# Patient Record
Sex: Female | Born: 1955 | Race: White | Hispanic: No | Marital: Married | Smoking: Former smoker
Health system: Southern US, Community
[De-identification: ages and names within clinical notes are randomized; demographics above are authoritative.]

## PROBLEM LIST (undated history)

## (undated) DIAGNOSIS — K219 Gastro-esophageal reflux disease without esophagitis: Secondary | ICD-10-CM

## (undated) DIAGNOSIS — R0989 Other specified symptoms and signs involving the circulatory and respiratory systems: Secondary | ICD-10-CM

## (undated) DIAGNOSIS — R002 Palpitations: Secondary | ICD-10-CM

## (undated) DIAGNOSIS — K4090 Unilateral inguinal hernia, without obstruction or gangrene, not specified as recurrent: Secondary | ICD-10-CM

## (undated) DIAGNOSIS — T8859XA Other complications of anesthesia, initial encounter: Secondary | ICD-10-CM

## (undated) DIAGNOSIS — D582 Other hemoglobinopathies: Secondary | ICD-10-CM

## (undated) DIAGNOSIS — E7800 Pure hypercholesterolemia, unspecified: Secondary | ICD-10-CM

## (undated) DIAGNOSIS — R251 Tremor, unspecified: Secondary | ICD-10-CM

## (undated) DIAGNOSIS — I10 Essential (primary) hypertension: Secondary | ICD-10-CM

## (undated) HISTORY — DX: Pure hypercholesterolemia, unspecified: E78.00

## (undated) HISTORY — PX: COLONOSCOPY: SHX174

## (undated) HISTORY — DX: Essential (primary) hypertension: I10

---

## 2004-11-16 ENCOUNTER — Ambulatory Visit: Payer: Self-pay | Admitting: Internal Medicine

## 2004-12-16 ENCOUNTER — Ambulatory Visit: Payer: Self-pay | Admitting: Internal Medicine

## 2006-01-31 ENCOUNTER — Ambulatory Visit: Payer: Self-pay | Admitting: Internal Medicine

## 2007-02-06 ENCOUNTER — Ambulatory Visit: Payer: Self-pay | Admitting: Internal Medicine

## 2008-02-14 ENCOUNTER — Ambulatory Visit: Payer: Self-pay | Admitting: Internal Medicine

## 2008-12-22 ENCOUNTER — Ambulatory Visit: Payer: Self-pay | Admitting: Internal Medicine

## 2009-02-26 ENCOUNTER — Ambulatory Visit: Payer: Self-pay | Admitting: Internal Medicine

## 2009-03-12 ENCOUNTER — Ambulatory Visit: Payer: Self-pay | Admitting: Internal Medicine

## 2009-05-14 ENCOUNTER — Emergency Department: Payer: Self-pay

## 2010-04-05 ENCOUNTER — Ambulatory Visit: Payer: Self-pay | Admitting: Internal Medicine

## 2011-04-14 ENCOUNTER — Ambulatory Visit: Payer: Self-pay | Admitting: Internal Medicine

## 2012-04-17 ENCOUNTER — Ambulatory Visit: Payer: Self-pay | Admitting: Unknown Physician Specialty

## 2012-07-19 ENCOUNTER — Telehealth: Payer: Self-pay | Admitting: *Deleted

## 2012-07-19 NOTE — Telephone Encounter (Signed)
Pharmacy called wanting authorization to refill patients meds Metoprolol ER 90 with o/refills, Losartan 100-25 90 o/refill. Dr. Lorin Picket approved.

## 2012-09-06 ENCOUNTER — Other Ambulatory Visit (HOSPITAL_COMMUNITY)
Admission: RE | Admit: 2012-09-06 | Discharge: 2012-09-06 | Disposition: A | Discharge status: Home / Self Care | Payer: BC Managed Care – PPO | Source: Ambulatory Visit | Attending: Internal Medicine | Admitting: Internal Medicine

## 2012-09-06 ENCOUNTER — Encounter: Payer: Self-pay | Admitting: Internal Medicine

## 2012-09-06 ENCOUNTER — Ambulatory Visit (INDEPENDENT_AMBULATORY_CARE_PROVIDER_SITE_OTHER): Payer: BC Managed Care – PPO | Admitting: Internal Medicine

## 2012-09-06 VITALS — BP 138/78 | HR 72 | Temp 98.2°F | Ht 63.0 in | Wt 138.0 lb

## 2012-09-06 DIAGNOSIS — N951 Menopausal and female climacteric states: Secondary | ICD-10-CM

## 2012-09-06 DIAGNOSIS — R17 Unspecified jaundice: Secondary | ICD-10-CM

## 2012-09-06 DIAGNOSIS — R5383 Other fatigue: Secondary | ICD-10-CM

## 2012-09-06 DIAGNOSIS — Z01419 Encounter for gynecological examination (general) (routine) without abnormal findings: Secondary | ICD-10-CM | POA: Insufficient documentation

## 2012-09-06 DIAGNOSIS — R5381 Other malaise: Secondary | ICD-10-CM

## 2012-09-06 DIAGNOSIS — E7800 Pure hypercholesterolemia, unspecified: Secondary | ICD-10-CM

## 2012-09-06 DIAGNOSIS — I10 Essential (primary) hypertension: Secondary | ICD-10-CM

## 2012-09-06 DIAGNOSIS — Z139 Encounter for screening, unspecified: Secondary | ICD-10-CM

## 2012-09-06 DIAGNOSIS — D72819 Decreased white blood cell count, unspecified: Secondary | ICD-10-CM

## 2012-09-06 DIAGNOSIS — Z1151 Encounter for screening for human papillomavirus (HPV): Secondary | ICD-10-CM | POA: Insufficient documentation

## 2012-09-06 NOTE — Patient Instructions (Addendum)
It was nice seeing you today.  Let me know if you need anything.   

## 2012-09-09 ENCOUNTER — Encounter: Payer: Self-pay | Admitting: Internal Medicine

## 2012-09-09 DIAGNOSIS — N951 Menopausal and female climacteric states: Secondary | ICD-10-CM | POA: Insufficient documentation

## 2012-09-09 DIAGNOSIS — D72819 Decreased white blood cell count, unspecified: Secondary | ICD-10-CM | POA: Insufficient documentation

## 2012-09-09 NOTE — Assessment & Plan Note (Signed)
Blood pressure under better control.  Same meds.  Check metabolic panel.   

## 2012-09-09 NOTE — Assessment & Plan Note (Signed)
Last liver panel checked wnl.  Asymptomatic.  Abdominal ultrasound 4/10 revealed no significant abnormality.

## 2012-09-09 NOTE — Assessment & Plan Note (Signed)
Low cholesterol diet and exercise.  Check lipid panel.   

## 2012-09-09 NOTE — Assessment & Plan Note (Signed)
Discussed treatment options.  Follow.  She wants to think about her options.

## 2012-09-09 NOTE — Progress Notes (Signed)
  Subjective:    Patient ID: Melinda Barber, female    DOB: September 28, 1955, 56 y.o.   MRN: [id number]  HPI 56 year old female with past history of hypertension and hypercholesterolemia who comes in today to follow up on these issues as well as for a complete physical exam. Having some issues with menopausal symptoms.  Increased flushing and mood swings.  No periods now for several years.  No spotting or vaginal symptoms.  Blood pressure (she feels) is doing better.  Eating and drinking well.  Handling stress relatively well.  No bowel change.  Some fatigue, but overall she feels like she is doing relatively well.   Past Medical History  Diagnosis Date  . Hypertension   . Hypercholesterolemia     Current Outpatient Prescriptions on File Prior to Visit  Medication Sig Dispense Refill  . amLODipine (NORVASC) 5 MG tablet Take 5 mg by mouth daily.      Marland Kitchen losartan-hydrochlorothiazide (HYZAAR) 100-25 MG per tablet Take 1 tablet by mouth daily.      . metoprolol succinate (TOPROL-XL) 25 MG 24 hr tablet Take 25 mg by mouth daily.        Review of Systems Patient denies any headache, lightheadedness or dizziness.  No sinus or allergy symptoms.  No chest pain, tightness or palpitations.  No increased shortness of breath, cough or congestion.  No nausea or vomiting.  No abdominal pain or cramping.  No bowel change, such as diarrhea, constipation, BRBPR or melana.  No urine change.   No vaginal problems.  Hot flashes and mood swings as outlined.  Desires not to take estrogen.       Objective:   Physical Exam Filed Vitals:   09/06/12 1108  BP: 138/78  Pulse: 72  Temp: 98.2 F (71.72 C)   56 year old female in no acute distress.   HEENT:  Nares- clear.  Oropharynx - without lesions. NECK:  Supple.  Nontender.  No audible bruit.  HEART:  Appears to be regular. LUNGS:  No crackles or wheezing audible.  Respirations even and unlabored.  RADIAL PULSE:  Equal bilaterally.    BREASTS:  No nipple discharge  or nipple retraction present.  Could not appreciate any distinct nodules or axillary adenopathy.  ABDOMEN:  Soft, nontender.  Bowel sounds present and normal.  No audible abdominal bruit.  GU:  Normal external genitalia.  Vaginal vault without lesions.  Cervix identified.  Pap performed. Could not appreciate any adnexal masses or tenderness.   RECTAL:  Heme negative.   EXTREMITIES:  No increased edema present.  DP pulses palpable and equal bilaterally.           Assessment & Plan:  INCREASED PSYCHOSOCIAL STRESSORS.  Doing well on no medications.  Follow.   FATIGUE.  Check cbc, met c and tsh.   HEALTH MAINTENANCE.  Physical today.  Schedule mammogram.  We have discussed screening colonoscopy.  Has financial concerns.  Will check with her insurance.  IFOB.

## 2012-09-09 NOTE — Assessment & Plan Note (Signed)
Last cbc revealed a normal wbc count.  Check cbc.

## 2012-09-18 ENCOUNTER — Other Ambulatory Visit (INDEPENDENT_AMBULATORY_CARE_PROVIDER_SITE_OTHER): Payer: BC Managed Care – PPO

## 2012-09-18 DIAGNOSIS — R5383 Other fatigue: Secondary | ICD-10-CM

## 2012-09-18 DIAGNOSIS — E7800 Pure hypercholesterolemia, unspecified: Secondary | ICD-10-CM

## 2012-09-18 DIAGNOSIS — R5381 Other malaise: Secondary | ICD-10-CM

## 2012-09-18 LAB — LIPID PANEL
Cholesterol: 203 mg/dL — ABNORMAL HIGH (ref 0–200)
Triglycerides: 179 mg/dL — ABNORMAL HIGH (ref 0.0–149.0)

## 2012-09-18 LAB — COMPREHENSIVE METABOLIC PANEL
Albumin: 4.1 g/dL (ref 3.5–5.2)
BUN: 15 mg/dL (ref 6–23)
CO2: 29 mEq/L (ref 19–32)
Calcium: 9.2 mg/dL (ref 8.4–10.5)
Chloride: 103 mEq/L (ref 96–112)
Creatinine, Ser: 0.7 mg/dL (ref 0.4–1.2)
GFR: 86.79 mL/min (ref >60.00)
Glucose, Bld: 96 mg/dL (ref 70–99)
Potassium: 3.7 mEq/L (ref 3.5–5.1)

## 2012-09-18 LAB — CBC WITH DIFFERENTIAL/PLATELET
Basophils Relative: 0.6 % (ref 0.0–3.0)
Eosinophils Relative: 2.4 % (ref 0.0–5.0)
Hemoglobin: 13.5 g/dL (ref 12.0–15.0)
Lymphocytes Relative: 41.5 % (ref 12.0–46.0)
MCHC: 33.3 g/dL (ref 30.0–36.0)
Monocytes Relative: 9.1 % (ref 3.0–12.0)
Neutro Abs: 2 10*3/uL (ref 1.4–7.7)
RBC: 4.35 Mil/uL (ref 3.87–5.11)
WBC: 4.3 10*3/uL — ABNORMAL LOW (ref 4.5–10.5)

## 2012-09-19 ENCOUNTER — Other Ambulatory Visit: Payer: Self-pay | Admitting: Internal Medicine

## 2012-09-19 NOTE — Progress Notes (Signed)
Order placed for follow up lab.  

## 2012-09-20 ENCOUNTER — Telehealth: Payer: Self-pay | Admitting: *Deleted

## 2012-09-21 ENCOUNTER — Encounter: Payer: Self-pay | Admitting: *Deleted

## 2012-09-24 ENCOUNTER — Telehealth: Payer: Self-pay | Admitting: *Deleted

## 2012-09-24 NOTE — Telephone Encounter (Signed)
Pt called back for her lab results, pt was notified and will be calling sometime next week to schedule her non fasting lab

## 2012-10-08 ENCOUNTER — Other Ambulatory Visit (INDEPENDENT_AMBULATORY_CARE_PROVIDER_SITE_OTHER): Payer: BC Managed Care – PPO

## 2012-10-08 DIAGNOSIS — R17 Unspecified jaundice: Secondary | ICD-10-CM

## 2012-10-08 LAB — HEPATIC FUNCTION PANEL
AST: 23 U/L (ref 0–37)
Albumin: 4.2 g/dL (ref 3.5–5.2)
Alkaline Phosphatase: 80 U/L (ref 39–117)
Bilirubin, Direct: 0.1 mg/dL (ref 0.0–0.3)
Total Protein: 7.3 g/dL (ref 6.0–8.3)

## 2012-10-10 NOTE — Telephone Encounter (Signed)
Open in error

## 2012-11-12 ENCOUNTER — Other Ambulatory Visit: Payer: Self-pay | Admitting: *Deleted

## 2012-11-12 ENCOUNTER — Telehealth: Payer: Self-pay | Admitting: *Deleted

## 2012-11-12 MED ORDER — AMLODIPINE BESYLATE 5 MG PO TABS: 5.0000 mg | ORAL_TABLET | Freq: Every day | ORAL | Status: DC

## 2012-11-12 NOTE — Telephone Encounter (Signed)
Sent in to pharmacy.  

## 2012-11-20 ENCOUNTER — Ambulatory Visit: Payer: Self-pay | Admitting: Emergency Medicine

## 2012-11-20 ENCOUNTER — Ambulatory Visit: Payer: BC Managed Care – PPO | Admitting: Adult Health

## 2013-03-04 DIAGNOSIS — C44310 Basal cell carcinoma of skin of unspecified parts of face: Secondary | ICD-10-CM | POA: Insufficient documentation

## 2013-03-18 ENCOUNTER — Encounter: Payer: Self-pay | Admitting: Internal Medicine

## 2013-03-18 ENCOUNTER — Ambulatory Visit (INDEPENDENT_AMBULATORY_CARE_PROVIDER_SITE_OTHER): Payer: BC Managed Care – PPO | Admitting: Internal Medicine

## 2013-03-18 VITALS — BP 130/90 | HR 69 | Temp 98.6°F | Ht 63.0 in | Wt 134.5 lb

## 2013-03-18 DIAGNOSIS — D72819 Decreased white blood cell count, unspecified: Secondary | ICD-10-CM

## 2013-03-18 DIAGNOSIS — I10 Essential (primary) hypertension: Secondary | ICD-10-CM

## 2013-03-18 DIAGNOSIS — R17 Unspecified jaundice: Secondary | ICD-10-CM

## 2013-03-18 DIAGNOSIS — N951 Menopausal and female climacteric states: Secondary | ICD-10-CM

## 2013-03-18 DIAGNOSIS — E7800 Pure hypercholesterolemia, unspecified: Secondary | ICD-10-CM

## 2013-03-18 LAB — LDL CHOLESTEROL, DIRECT: Direct LDL: 143.8 mg/dL

## 2013-03-18 LAB — CBC WITH DIFFERENTIAL/PLATELET
Basophils Absolute: 0 10*3/uL (ref 0.0–0.1)
Basophils Relative: 0.5 % (ref 0.0–3.0)
Eosinophils Absolute: 0.2 10*3/uL (ref 0.0–0.7)
Hemoglobin: 14.3 g/dL (ref 12.0–15.0)
Lymphs Abs: 1.7 10*3/uL (ref 0.7–4.0)
MCHC: 34.2 g/dL (ref 30.0–36.0)
MCV: 93 fl (ref 78.0–100.0)
Monocytes Absolute: 0.4 10*3/uL (ref 0.1–1.0)
Neutro Abs: 2.1 10*3/uL (ref 1.4–7.7)
RBC: 4.5 Mil/uL (ref 3.87–5.11)
RDW: 13.2 % (ref 11.5–14.6)

## 2013-03-18 LAB — BASIC METABOLIC PANEL
BUN: 18 mg/dL (ref 6–23)
Calcium: 9.5 mg/dL (ref 8.4–10.5)
GFR: 92.37 mL/min (ref >60.00)
Glucose, Bld: 100 mg/dL — ABNORMAL HIGH (ref 70–99)
Potassium: 4.1 mEq/L (ref 3.5–5.1)
Sodium: 138 mEq/L (ref 135–145)

## 2013-03-18 LAB — LIPID PANEL: Cholesterol: 222 mg/dL — ABNORMAL HIGH (ref 0–200)

## 2013-03-18 MED ORDER — AMLODIPINE BESYLATE 5 MG PO TABS: 5.0000 mg | ORAL_TABLET | Freq: Every day | ORAL | Status: DC

## 2013-03-18 MED ORDER — LOSARTAN POTASSIUM-HCTZ 100-25 MG PO TABS: 1.0000 | ORAL_TABLET | Freq: Every day | ORAL | Status: DC

## 2013-03-18 MED ORDER — METOPROLOL SUCCINATE ER 25 MG PO TB24: 25.0000 mg | ORAL_TABLET | Freq: Every day | ORAL | Status: DC

## 2013-03-18 NOTE — Progress Notes (Signed)
  Subjective:    Patient ID: Melinda Barber, female    DOB: [birth year], 57 y.o.   MRN: [id number]  HPI 57 year old female with past history of hypertension and hypercholesterolemia who comes in today for a scheduled follow up.   Overall she feels she is doing well.  Eating and drinking well.  Handling stress relatively well.  No bowel change.  No chest pain or tightness.  Breathing stable.  Had a basal cell cancer removed from her nose.  Seeing Dr Lorn Junes at [facility]    Past Medical History  Diagnosis Date  . Hypertension   . Hypercholesterolemia     Current Outpatient Prescriptions on File Prior to Visit  Medication Sig Dispense Refill  . amLODipine (NORVASC) 5 MG tablet Take 1 tablet (5 mg total) by mouth daily.  30 tablet  5  . losartan-hydrochlorothiazide (HYZAAR) 100-25 MG per tablet Take 1 tablet by mouth daily.      . metoprolol succinate (TOPROL-XL) 25 MG 24 hr tablet Take 25 mg by mouth daily.      . Multiple Vitamin (MULTIVITAMIN) capsule Take 1 capsule by mouth daily.      . vitamin E 400 UNIT capsule Take 400 Units by mouth daily.       No current facility-administered medications on file prior to visit.    Review of Systems Patient denies any headache, lightheadedness or dizziness.  No sinus or allergy symptoms.  No chest pain, tightness or palpitations.  No increased shortness of breath, cough or congestion.  No nausea or vomiting.  No abdominal pain or cramping.  No bowel change, such as diarrhea, constipation, BRBPR or melana.  No urine change.   No vaginal problems.      Objective:   Physical Exam  Filed Vitals:   03/18/13 0757  BP: 130/90  Pulse: 69  Temp: 98.6 F (37 C)   Blood pressure recheck:  122/88, pulse 86  57 year old female in no acute distress.   HEENT:  Nares- clear.  Oropharynx - without lesions. NECK:  Supple.  Nontender.  No audible bruit.  HEART:  Appears to be regular. LUNGS:  No crackles or wheezing audible.  Respirations even and unlabored.   RADIAL PULSE:  Equal bilaterally. ABDOMEN:  Soft, nontender.  Bowel sounds present and normal.  No audible abdominal bruit.   EXTREMITIES:  No increased edema present.  DP pulses palpable and equal bilaterally.           Assessment & Plan:  INCREASED PSYCHOSOCIAL STRESSORS.  Doing well on no medications.  Follow.   HEALTH MAINTENANCE.  Physical 09/06/12.  Schedule mammogram.  Last 04/17/12 - Birads II. We have discussed screening colonoscopy.  Has financial concerns.  Wants to postpone at this time.  Will notify me when agreeable.

## 2013-03-19 ENCOUNTER — Encounter: Payer: Self-pay | Admitting: Internal Medicine

## 2013-03-19 ENCOUNTER — Encounter: Payer: Self-pay | Admitting: *Deleted

## 2013-03-19 NOTE — Assessment & Plan Note (Signed)
Low cholesterol diet and exercise.  Check lipid panel.   

## 2013-03-19 NOTE — Assessment & Plan Note (Signed)
Follow cbc.  

## 2013-03-19 NOTE — Assessment & Plan Note (Signed)
Last bilirubin check wnl.   

## 2013-03-19 NOTE — Assessment & Plan Note (Signed)
Not reported as a significant issue today.  Follow.   

## 2013-03-19 NOTE — Assessment & Plan Note (Signed)
Blood pressure under better control.  Same meds.  Check metabolic panel.   

## 2013-04-18 ENCOUNTER — Ambulatory Visit: Payer: Self-pay | Admitting: Internal Medicine

## 2013-04-18 LAB — HM MAMMOGRAPHY

## 2013-04-21 ENCOUNTER — Encounter: Payer: Self-pay | Admitting: Internal Medicine

## 2013-04-25 ENCOUNTER — Encounter: Payer: Self-pay | Admitting: Internal Medicine

## 2013-09-17 ENCOUNTER — Encounter: Payer: Self-pay | Admitting: Internal Medicine

## 2013-09-17 ENCOUNTER — Encounter (INDEPENDENT_AMBULATORY_CARE_PROVIDER_SITE_OTHER): Payer: Self-pay

## 2013-09-17 ENCOUNTER — Other Ambulatory Visit: Payer: Self-pay | Admitting: Internal Medicine

## 2013-09-17 ENCOUNTER — Ambulatory Visit (INDEPENDENT_AMBULATORY_CARE_PROVIDER_SITE_OTHER): Payer: BC Managed Care – PPO | Admitting: Internal Medicine

## 2013-09-17 VITALS — BP 120/80 | HR 66 | Temp 98.3°F | Ht 63.75 in | Wt 129.0 lb

## 2013-09-17 DIAGNOSIS — N951 Menopausal and female climacteric states: Secondary | ICD-10-CM

## 2013-09-17 DIAGNOSIS — R17 Unspecified jaundice: Secondary | ICD-10-CM

## 2013-09-17 DIAGNOSIS — D72819 Decreased white blood cell count, unspecified: Secondary | ICD-10-CM

## 2013-09-17 DIAGNOSIS — I10 Essential (primary) hypertension: Secondary | ICD-10-CM

## 2013-09-17 DIAGNOSIS — E7800 Pure hypercholesterolemia, unspecified: Secondary | ICD-10-CM

## 2013-09-17 DIAGNOSIS — Z1211 Encounter for screening for malignant neoplasm of colon: Secondary | ICD-10-CM

## 2013-09-17 LAB — CBC WITH DIFFERENTIAL/PLATELET
Basophils Absolute: 0 10*3/uL (ref 0.0–0.1)
Eosinophils Absolute: 0.1 10*3/uL (ref 0.0–0.7)
Eosinophils Relative: 2 % (ref 0.0–5.0)
Hemoglobin: 14.3 g/dL (ref 12.0–15.0)
Lymphocytes Relative: 36 % (ref 12.0–46.0)
Lymphs Abs: 1.9 10*3/uL (ref 0.7–4.0)
MCHC: 33.8 g/dL (ref 30.0–36.0)
Monocytes Relative: 9.2 % (ref 3.0–12.0)
Neutro Abs: 2.8 10*3/uL (ref 1.4–7.7)
Platelets: 237 10*3/uL (ref 150.0–400.0)
RDW: 12.6 % (ref 11.5–14.6)

## 2013-09-17 LAB — LIPID PANEL
HDL: 38 mg/dL — ABNORMAL LOW (ref >39.00)
LDL Cholesterol: 100 mg/dL — ABNORMAL HIGH (ref 0–99)
Total CHOL/HDL Ratio: 4
Triglycerides: 98 mg/dL (ref 0.0–149.0)
VLDL: 19.6 mg/dL (ref 0.0–40.0)

## 2013-09-17 LAB — BASIC METABOLIC PANEL
BUN: 20 mg/dL (ref 6–23)
CO2: 31 mEq/L (ref 19–32)
Calcium: 9.4 mg/dL (ref 8.4–10.5)
Chloride: 101 mEq/L (ref 96–112)
GFR: 74.71 mL/min (ref >60.00)
Glucose, Bld: 83 mg/dL (ref 70–99)
Sodium: 138 mEq/L (ref 135–145)

## 2013-09-17 LAB — HEPATIC FUNCTION PANEL
ALT: 23 U/L (ref 0–35)
Albumin: 4.4 g/dL (ref 3.5–5.2)
Alkaline Phosphatase: 70 U/L (ref 39–117)
Bilirubin, Direct: 0.2 mg/dL (ref 0.0–0.3)
Total Bilirubin: 1.7 mg/dL — ABNORMAL HIGH (ref 0.3–1.2)
Total Protein: 6.7 g/dL (ref 6.0–8.3)

## 2013-09-17 LAB — TSH: TSH: 1.1 u[IU]/mL (ref 0.35–5.50)

## 2013-09-17 NOTE — Progress Notes (Signed)
  Subjective:    Patient ID: Melinda Barber, female    DOB: 10-11-1955, 57 y.o.   MRN: [id number]  HPI 57 year old female with past history of hypertension and hypercholesterolemia who comes in today to follow up on these issues as well as for a complete physical exam.  Overall she feels she is doing well.  Eating and drinking well. Exercising.  Has lost weight.   Handling stress relatively well.  No bowel change.  No chest pain or tightness.  Breathing stable.  Had a basal cell cancer removed from her nose.  Seeing Dr Lorn Junes at [facility].  Overall feels good.  States her blood pressure is averaging 120-130/78-90.  (rarely 90).     Past Medical History  Diagnosis Date  . Hypertension   . Hypercholesterolemia     Current Outpatient Prescriptions on File Prior to Visit  Medication Sig Dispense Refill  . amLODipine (NORVASC) 5 MG tablet Take 1 tablet (5 mg total) by mouth daily.  90 tablet  3  . losartan-hydrochlorothiazide (HYZAAR) 100-25 MG per tablet Take 1 tablet by mouth daily.  90 tablet  3  . metoprolol succinate (TOPROL-XL) 25 MG 24 hr tablet Take 1 tablet (25 mg total) by mouth daily.  90 tablet  3  . Multiple Vitamin (MULTIVITAMIN) capsule Take 1 capsule by mouth daily.      . vitamin E 400 UNIT capsule Take 400 Units by mouth daily.       No current facility-administered medications on file prior to visit.    Review of Systems Patient denies any headache, lightheadedness or dizziness.  No sinus or allergy symptoms.  No chest pain, tightness or palpitations.  No increased shortness of breath, cough or congestion.  No nausea or vomiting.  No abdominal pain or cramping.  No bowel change, such as diarrhea, constipation, BRBPR or melana.  No urine change.   No vaginal problems.  Blood pressure as outlined.  Exercising regularly.  Feels better.       Objective:   Physical Exam  Filed Vitals:   09/17/13 0923  BP: 120/80  Pulse: 66  Temp: 98.3 F (36.8 C)   Blood pressure recheck:   122/78, pulse 49  57 year old female in no acute distress.   HEENT:  Nares- clear.  Oropharynx - without lesions. NECK:  Supple.  Nontender.  No audible bruit.  HEART:  Appears to be regular. LUNGS:  No crackles or wheezing audible.  Respirations even and unlabored.  RADIAL PULSE:  Equal bilaterally.    BREASTS:  No nipple discharge or nipple retraction present.  Could not appreciate any distinct nodules or axillary adenopathy.  ABDOMEN:  Soft, nontender.  Bowel sounds present and normal.  No audible abdominal bruit.  GU:  Not performed.    EXTREMITIES:  No increased edema present.  DP pulses palpable and equal bilaterally.          Assessment & Plan:  INCREASED PSYCHOSOCIAL STRESSORS.  Doing well on no medications.  Follow.   HEALTH MAINTENANCE.  Physical today.  Mammogram 04/18/13 - Birads I.  We have discussed screening colonoscopy.  Has financial concerns.  Wants to postpone at this time.  Will notify me when agreeable.  IFOB given.

## 2013-09-17 NOTE — Progress Notes (Signed)
Pre-visit discussion using our clinic review tool. No additional management support is needed unless otherwise documented below in the visit note.  

## 2013-09-17 NOTE — Progress Notes (Signed)
Orders for labs placed 

## 2013-09-18 ENCOUNTER — Other Ambulatory Visit: Payer: Self-pay | Admitting: Internal Medicine

## 2013-09-18 NOTE — Progress Notes (Signed)
Order placed for f/u liver panel.  

## 2013-09-19 ENCOUNTER — Encounter: Payer: Self-pay | Admitting: Internal Medicine

## 2013-09-19 NOTE — Assessment & Plan Note (Signed)
Low cholesterol diet and exercise.  Check lipid panel.   

## 2013-09-19 NOTE — Assessment & Plan Note (Signed)
Follow cbc.  

## 2013-09-19 NOTE — Assessment & Plan Note (Signed)
Last bilirubin check wnl.

## 2013-09-19 NOTE — Assessment & Plan Note (Signed)
Blood pressure under better control.  Same meds.  Check metabolic panel.

## 2013-09-19 NOTE — Assessment & Plan Note (Signed)
No significant problems reported today.  Follow.  

## 2013-09-25 ENCOUNTER — Encounter: Payer: Self-pay | Admitting: *Deleted

## 2013-10-18 ENCOUNTER — Other Ambulatory Visit: Payer: BC Managed Care – PPO

## 2014-03-18 ENCOUNTER — Encounter (INDEPENDENT_AMBULATORY_CARE_PROVIDER_SITE_OTHER): Payer: Self-pay

## 2014-03-18 ENCOUNTER — Encounter: Payer: Self-pay | Admitting: Internal Medicine

## 2014-03-18 ENCOUNTER — Ambulatory Visit (INDEPENDENT_AMBULATORY_CARE_PROVIDER_SITE_OTHER): Payer: BC Managed Care – PPO | Admitting: Internal Medicine

## 2014-03-18 VITALS — BP 120/80 | HR 63 | Temp 98.6°F | Ht 63.75 in | Wt 133.2 lb

## 2014-03-18 DIAGNOSIS — Z1239 Encounter for other screening for malignant neoplasm of breast: Secondary | ICD-10-CM

## 2014-03-18 DIAGNOSIS — E7800 Pure hypercholesterolemia, unspecified: Secondary | ICD-10-CM

## 2014-03-18 DIAGNOSIS — R17 Unspecified jaundice: Secondary | ICD-10-CM

## 2014-03-18 DIAGNOSIS — Z1211 Encounter for screening for malignant neoplasm of colon: Secondary | ICD-10-CM

## 2014-03-18 DIAGNOSIS — N951 Menopausal and female climacteric states: Secondary | ICD-10-CM

## 2014-03-18 DIAGNOSIS — I10 Essential (primary) hypertension: Secondary | ICD-10-CM

## 2014-03-18 DIAGNOSIS — D72819 Decreased white blood cell count, unspecified: Secondary | ICD-10-CM

## 2014-03-18 LAB — LIPID PANEL
CHOL/HDL RATIO: 3
Cholesterol: 190 mg/dL (ref 0–200)
HDL: 59.3 mg/dL (ref >39.00)
LDL CALC: 106 mg/dL — AB (ref 0–99)
NONHDL: 130.7
TRIGLYCERIDES: 124 mg/dL (ref 0.0–149.0)
VLDL: 24.8 mg/dL (ref 0.0–40.0)

## 2014-03-18 LAB — HEPATIC FUNCTION PANEL
ALBUMIN: 4.3 g/dL (ref 3.5–5.2)
ALK PHOS: 69 U/L (ref 39–117)
ALT: 22 U/L (ref 0–35)
AST: 24 U/L (ref 0–37)
Bilirubin, Direct: 0.1 mg/dL (ref 0.0–0.3)
TOTAL PROTEIN: 7.1 g/dL (ref 6.0–8.3)
Total Bilirubin: 1.2 mg/dL (ref 0.2–1.2)

## 2014-03-18 LAB — BASIC METABOLIC PANEL
BUN: 17 mg/dL (ref 6–23)
CALCIUM: 9.3 mg/dL (ref 8.4–10.5)
CO2: 31 mEq/L (ref 19–32)
Chloride: 102 mEq/L (ref 96–112)
Creatinine, Ser: 0.8 mg/dL (ref 0.4–1.2)
GFR: 81.23 mL/min (ref >60.00)
Glucose, Bld: 105 mg/dL — ABNORMAL HIGH (ref 70–99)
Potassium: 4.4 mEq/L (ref 3.5–5.1)
SODIUM: 138 meq/L (ref 135–145)

## 2014-03-18 NOTE — Progress Notes (Signed)
  Subjective:    Patient ID: Melinda Barber, female    DOB: 09/18/1956, 58 y.o.   MRN: [id number]  HPI 58 year old female with past history of hypertension and hypercholesterolemia who comes in today for a scheduled follow up.  Overall she feels she is doing well.  Eating and drinking well. Her son just recently got married.  Increased strss related to this.  Is back to exercising.  Watching her diet better now.  Handling stress relatively well.  No bowel change.  No chest pain or tightness.  Breathing stable.  Had a basal cell cancer removed from her nose.  See[facility].  Overall feels good.  States her blood pressure is averaging 120-130/78-90.  (rarely 90).     Past Medical History  Diagnosis Date  . Hypertension   . Hypercholesterolemia     Current Outpatient Prescriptions on File Prior to Visit  Medication Sig Dispense Refill  . amLODipine (NORVASC) 5 MG tablet Take 1 tablet (5 mg total) by mouth daily.  90 tablet  3  . losartan-hydrochlorothiazide (HYZAAR) 100-25 MG per tablet Take 1 tablet by mouth daily.  90 tablet  3  . metoprolol succinate (TOPROL-XL) 25 MG 24 hr tablet Take 1 tablet (25 mg total) by mouth daily.  90 tablet  3  . Multiple Vitamin (MULTIVITAMIN) capsule Take 1 capsule by mouth daily.      . vitamin E 400 UNIT capsule Take 400 Units by mouth daily.       No current facility-administered medications on file prior to visit.    Review of Systems Patient denies any headache, lightheadedness or dizziness.  No sinus or allergy symptoms.  No chest pain, tightness or palpitations.  No increased shortness of breath, cough or congestion.  No nausea or vomiting.  No abdominal pain or cramping.  No bowel change, such as diarrhea, constipation, BRBPR or melana.  No urine change.   No vaginal problems. Blood pressure as outlined.  Back to exercising.        Objective:   Physical Exam  Filed Vitals:   03/18/14 0757  BP: 120/80  Pulse: 63  Temp: 98.6 F (37 C)    Blood pressure recheck:  67140/284  58 year old female in no acute distress.   HEENT:  Nares- clear.  Oropharynx - without lesions. NECK:  Supple.  Nontender.  No audible bruit.  HEART:  Appears to be regular. LUNGS:  No crackles or wheezing audible.  Respirations even and unlabored.  RADIAL PULSE:  Equal bilaterally.  ABDOMEN:  Soft, nontender.  Bowel sounds present and normal.  No audible abdominal bruit.   EXTREMITIES:  No increased edema present.  DP pulses palpable and equal bilaterally.          Assessment & Plan:  INCREASED PSYCHOSOCIAL STRESSORS.  Doing well on no medications.  Follow.   HEALTH MAINTENANCE.  Physical 09/17/13.  Mammogram 04/18/13 - Birads I.  Schedule f/u mammogram.  We have discussed screening colonoscopy.  Has financial concerns.  Wants to postpone at this time.  Will notify me when agreeable.  IFOB returned today.

## 2014-03-19 LAB — FECAL OCCULT BLOOD, IMMUNOCHEMICAL: Fecal Occult Bld: POSITIVE — AB

## 2014-03-20 ENCOUNTER — Telehealth: Payer: Self-pay | Admitting: Internal Medicine

## 2014-03-20 ENCOUNTER — Other Ambulatory Visit: Payer: Self-pay | Admitting: Internal Medicine

## 2014-03-20 ENCOUNTER — Encounter: Payer: Self-pay | Admitting: *Deleted

## 2014-03-20 DIAGNOSIS — R739 Hyperglycemia, unspecified: Secondary | ICD-10-CM

## 2014-03-20 DIAGNOSIS — R195 Other fecal abnormalities: Secondary | ICD-10-CM

## 2014-03-20 NOTE — Progress Notes (Signed)
Order placed for f/u fasting labs.  

## 2014-03-20 NOTE — Telephone Encounter (Signed)
Order placed for GI referral.   

## 2014-03-21 ENCOUNTER — Encounter: Payer: Self-pay | Admitting: Internal Medicine

## 2014-03-21 NOTE — Assessment & Plan Note (Signed)
Low cholesterol diet and exercise.  Check lipid panel.   

## 2014-03-21 NOTE — Assessment & Plan Note (Signed)
Blood pressure as outlined.  Same meds.  Check metabolic panel.  Have her spot check her sugar and send in readings over the next few weeks.  Schedule a f/u appt soon, to f/u on her blood pressure.

## 2014-03-21 NOTE — Assessment & Plan Note (Signed)
Follow cbc.  

## 2014-03-21 NOTE — Assessment & Plan Note (Signed)
Recheck liver panel.  

## 2014-03-21 NOTE — Assessment & Plan Note (Signed)
Not reported as a significant issue today.  Follow.   

## 2014-04-04 ENCOUNTER — Other Ambulatory Visit: Payer: Self-pay | Admitting: Internal Medicine

## 2014-04-18 ENCOUNTER — Other Ambulatory Visit: Payer: BC Managed Care – PPO

## 2014-04-22 ENCOUNTER — Ambulatory Visit: Payer: Self-pay | Admitting: Internal Medicine

## 2014-04-22 LAB — HM MAMMOGRAPHY: HM Mammogram: NEGATIVE

## 2014-04-23 ENCOUNTER — Encounter: Payer: Self-pay | Admitting: Internal Medicine

## 2014-04-23 ENCOUNTER — Encounter: Payer: Self-pay | Admitting: *Deleted

## 2014-04-28 DIAGNOSIS — R195 Other fecal abnormalities: Secondary | ICD-10-CM | POA: Insufficient documentation

## 2014-05-06 ENCOUNTER — Other Ambulatory Visit: Payer: Self-pay | Admitting: Internal Medicine

## 2014-06-12 ENCOUNTER — Ambulatory Visit: Payer: BC Managed Care – PPO | Admitting: Internal Medicine

## 2014-06-12 DIAGNOSIS — Z0289 Encounter for other administrative examinations: Secondary | ICD-10-CM

## 2014-06-26 LAB — HM COLONOSCOPY

## 2014-11-05 ENCOUNTER — Other Ambulatory Visit: Payer: Self-pay | Admitting: Internal Medicine

## 2014-11-05 NOTE — Telephone Encounter (Signed)
Appt 11/06/14

## 2014-11-06 ENCOUNTER — Ambulatory Visit (INDEPENDENT_AMBULATORY_CARE_PROVIDER_SITE_OTHER): Payer: BC Managed Care – PPO | Admitting: Internal Medicine

## 2014-11-06 ENCOUNTER — Encounter: Payer: Self-pay | Admitting: Internal Medicine

## 2014-11-06 VITALS — BP 120/80 | HR 66 | Temp 97.9°F | Ht 63.75 in | Wt 136.5 lb

## 2014-11-06 DIAGNOSIS — E7800 Pure hypercholesterolemia, unspecified: Secondary | ICD-10-CM

## 2014-11-06 DIAGNOSIS — I10 Essential (primary) hypertension: Secondary | ICD-10-CM

## 2014-11-06 DIAGNOSIS — Z0000 Encounter for general adult medical examination without abnormal findings: Secondary | ICD-10-CM

## 2014-11-06 DIAGNOSIS — D72819 Decreased white blood cell count, unspecified: Secondary | ICD-10-CM

## 2014-11-06 DIAGNOSIS — R739 Hyperglycemia, unspecified: Secondary | ICD-10-CM

## 2014-11-06 NOTE — Progress Notes (Signed)
Pre visit review using our clinic review tool, if applicable. No additional management support is needed unless otherwise documented below in the visit note. 

## 2014-11-06 NOTE — Progress Notes (Signed)
Patient ID: Melinda Barber, female   DOB: 05/22/1956, 59 y.o.   MRN: [id number]   Subjective:    Patient ID: Melinda Barber, female    DOB: 11/21/1955, 59 y.o.   MRN: [id number]  HPI  Patient here for a scheduled follow up.  States she is doing well.  No cardiac symptoms with increased activity or exertion.  Breathing stable.  Eating and drinking well.  Bowels stable.  Stress better.  Feels she is handling things well.     Past Medical History  Diagnosis Date  . Hypertension   . Hypercholesterolemia     Current Outpatient Prescriptions on File Prior to Visit  Medication Sig Dispense Refill  . amLODipine (NORVASC) 5 MG tablet TAKE 1 TABLET (5 MG TOTAL) BY MOUTH DAILY. 90 tablet 1  . losartan-hydrochlorothiazide (HYZAAR) 100-25 MG per tablet TAKE 1 TABLET BY MOUTH DAILY. 90 tablet 3  . metoprolol succinate (TOPROL-XL) 25 MG 24 hr tablet TAKE 1 TABLET (25 MG TOTAL) BY MOUTH DAILY. 90 tablet 3  . Multiple Vitamin (MULTIVITAMIN) capsule Take 1 capsule by mouth daily.    . vitamin E 400 UNIT capsule Take 400 Units by mouth daily.     No current facility-administered medications on file prior to visit.    Review of Systems  Constitutional: Negative for appetite change and unexpected weight change.  HENT: Negative for congestion and sinus pressure.   Respiratory: Negative for cough, chest tightness and shortness of breath.   Cardiovascular: Negative for chest pain, palpitations and leg swelling.  Gastrointestinal: Negative for nausea, vomiting, abdominal pain and diarrhea.  Genitourinary: Negative for dysuria and difficulty urinating.  Skin: Negative for color change and rash.  Neurological: Negative for dizziness, light-headedness and headaches.       Objective:    Physical Exam  Constitutional: She appears well-developed and well-nourished. No distress.  HENT:  Nose: Nose normal.  Mouth/Throat: Oropharynx is clear and moist.  Neck: Neck supple. No thyromegaly present.    Cardiovascular: Normal rate and regular rhythm.   Pulmonary/Chest: Breath sounds normal. No respiratory distress. She has no wheezes.  Abdominal: Soft. Bowel sounds are normal. There is no tenderness.  Musculoskeletal: She exhibits no edema or tenderness.  Lymphadenopathy:    She has no cervical adenopathy.    BP 120/80 mmHg  Pulse 66  Temp(Src) 97.9 F (36.6 C) (Oral)  Ht 5' 3.75" (1.619 m)  Wt 136 lb 8 oz (61.916 kg)  BMI 23.62 kg/m2  SpO2 99%  LMP 06/28/2006 Wt Readings from Last 3 Encounters:  11/06/14 136 lb 8 oz (61.916 kg)  03/18/14 133 lb 4 oz (60.442 kg)  09/17/13 129 lb (58.514 kg)     Lab Results  Component Value Date   WBC 5.3 09/17/2013   HGB 14.3 09/17/2013   HCT 42.2 09/17/2013   PLT 237.0 09/17/2013   GLUCOSE 105* 03/18/2014   CHOL 190 03/18/2014   TRIG 124.0 03/18/2014   HDL 59.30 03/18/2014   LDLDIRECT 143.8 03/18/2013   LDLCALC 106* 03/18/2014   ALT 22 03/18/2014   AST 24 03/18/2014   NA 138 03/18/2014   K 4.4 03/18/2014   CL 102 03/18/2014   CREATININE 0.8 03/18/2014   BUN 17 03/18/2014   CO2 31 03/18/2014   TSH 1.10 09/17/2013       Assessment & Plan:   Problem List Items Addressed This Visit    Health care maintenance    Schedule physical next visit.  Mammogram 04/22/14 - Birads I.  Had colonoscopy  06/2014.        Hyperbilirubinemia    Recheck liver panel with next labs.        Relevant Orders   Hepatic function panel   Hypercholesterolemia    Low cholesterol diet and exercise.  Follow lipid panel.        Relevant Orders   Lipid panel   Hypertension - Primary    Blood pressure under good control.  Same medication regimen.  Check metabolic panel.       Relevant Orders   Basic metabolic panel   Leukopenia    Recheck cbc with next labs.        Relevant Orders   CBC with Differential/Platelet    Other Visit Diagnoses    Hyperglycemia        Relevant Orders    TSH    Hemoglobin A1c        Dale [place],  MD

## 2014-11-10 ENCOUNTER — Encounter: Payer: Self-pay | Admitting: Internal Medicine

## 2014-11-10 DIAGNOSIS — Z0000 Encounter for general adult medical examination without abnormal findings: Secondary | ICD-10-CM | POA: Insufficient documentation

## 2014-11-10 NOTE — Assessment & Plan Note (Signed)
Low cholesterol diet and exercise.  Follow lipid panel.   

## 2014-11-10 NOTE — Assessment & Plan Note (Signed)
Recheck liver panel with next labs.   

## 2014-11-10 NOTE — Assessment & Plan Note (Signed)
Schedule physical next visit.  Mammogram 04/22/14 - Birads I.  Had colonoscopy 06/2014.

## 2014-11-10 NOTE — Assessment & Plan Note (Signed)
Recheck cbc with next labs.   

## 2014-11-10 NOTE — Assessment & Plan Note (Signed)
Blood pressure under good control.  Same medication regimen.  Check metabolic panel.    

## 2014-11-13 ENCOUNTER — Other Ambulatory Visit (INDEPENDENT_AMBULATORY_CARE_PROVIDER_SITE_OTHER): Payer: BC Managed Care – PPO

## 2014-11-13 DIAGNOSIS — E7800 Pure hypercholesterolemia, unspecified: Secondary | ICD-10-CM

## 2014-11-13 DIAGNOSIS — R739 Hyperglycemia, unspecified: Secondary | ICD-10-CM

## 2014-11-13 DIAGNOSIS — I10 Essential (primary) hypertension: Secondary | ICD-10-CM

## 2014-11-13 DIAGNOSIS — D72819 Decreased white blood cell count, unspecified: Secondary | ICD-10-CM

## 2014-11-13 LAB — BASIC METABOLIC PANEL
BUN: 16 mg/dL (ref 6–23)
CALCIUM: 9.5 mg/dL (ref 8.4–10.5)
CO2: 31 mEq/L (ref 19–32)
Chloride: 101 mEq/L (ref 96–112)
Creatinine, Ser: 0.78 mg/dL (ref 0.40–1.20)
GFR: 81.04 mL/min (ref >60.00)
GLUCOSE: 100 mg/dL — AB (ref 70–99)
POTASSIUM: 4.1 meq/L (ref 3.5–5.1)
Sodium: 137 mEq/L (ref 135–145)

## 2014-11-13 LAB — CBC WITH DIFFERENTIAL/PLATELET
Basophils Absolute: 0 10*3/uL (ref 0.0–0.1)
Basophils Relative: 0.6 % (ref 0.0–3.0)
EOS ABS: 0.1 10*3/uL (ref 0.0–0.7)
EOS PCT: 2.1 % (ref 0.0–5.0)
HCT: 41.8 % (ref 36.0–46.0)
Hemoglobin: 14.3 g/dL (ref 12.0–15.0)
Lymphocytes Relative: 34.1 % (ref 12.0–46.0)
Lymphs Abs: 1.8 10*3/uL (ref 0.7–4.0)
MCHC: 34.3 g/dL (ref 30.0–36.0)
MCV: 90.7 fl (ref 78.0–100.0)
Monocytes Absolute: 0.4 10*3/uL (ref 0.1–1.0)
Monocytes Relative: 8.2 % (ref 3.0–12.0)
NEUTROS PCT: 55 % (ref 43.0–77.0)
Neutro Abs: 2.9 10*3/uL (ref 1.4–7.7)
PLATELETS: 224 10*3/uL (ref 150.0–400.0)
RBC: 4.61 Mil/uL (ref 3.87–5.11)
RDW: 12.5 % (ref 11.5–15.5)
WBC: 5.3 10*3/uL (ref 4.0–10.5)

## 2014-11-13 LAB — HEPATIC FUNCTION PANEL
ALT: 26 U/L (ref 0–35)
AST: 25 U/L (ref 0–37)
Albumin: 4.4 g/dL (ref 3.5–5.2)
Alkaline Phosphatase: 86 U/L (ref 39–117)
Bilirubin, Direct: 0.2 mg/dL (ref 0.0–0.3)
Total Bilirubin: 1.4 mg/dL — ABNORMAL HIGH (ref 0.2–1.2)
Total Protein: 7.1 g/dL (ref 6.0–8.3)

## 2014-11-13 LAB — LIPID PANEL
Cholesterol: 171 mg/dL (ref 0–200)
HDL: 53.5 mg/dL (ref >39.00)
LDL Cholesterol: 93 mg/dL (ref 0–99)
NONHDL: 117.5
TRIGLYCERIDES: 125 mg/dL (ref 0.0–149.0)
Total CHOL/HDL Ratio: 3
VLDL: 25 mg/dL (ref 0.0–40.0)

## 2014-11-13 LAB — TSH: TSH: 0.98 u[IU]/mL (ref 0.35–4.50)

## 2014-11-13 LAB — HEMOGLOBIN A1C: Hgb A1c MFr Bld: 5.5 % (ref 4.6–6.5)

## 2014-11-14 ENCOUNTER — Encounter: Payer: Self-pay | Admitting: Internal Medicine

## 2014-11-14 LAB — GLUCOSE, FASTING: Glucose, Fasting: 88 mg/dL (ref 70–99)

## 2014-11-18 NOTE — Telephone Encounter (Signed)
Unread mychart message mailed to patient 

## 2015-02-26 ENCOUNTER — Other Ambulatory Visit (HOSPITAL_COMMUNITY)
Admission: RE | Admit: 2015-02-26 | Discharge: 2015-02-26 | Disposition: A | Discharge status: Home / Self Care | Payer: BC Managed Care – PPO | Source: Ambulatory Visit | Attending: Internal Medicine | Admitting: Internal Medicine

## 2015-02-26 ENCOUNTER — Ambulatory Visit (INDEPENDENT_AMBULATORY_CARE_PROVIDER_SITE_OTHER): Payer: BC Managed Care – PPO | Admitting: Internal Medicine

## 2015-02-26 ENCOUNTER — Encounter: Payer: Self-pay | Admitting: Internal Medicine

## 2015-02-26 VITALS — BP 130/80 | HR 71 | Temp 98.1°F | Ht 63.25 in | Wt 131.2 lb

## 2015-02-26 DIAGNOSIS — E7800 Pure hypercholesterolemia, unspecified: Secondary | ICD-10-CM

## 2015-02-26 DIAGNOSIS — Z0000 Encounter for general adult medical examination without abnormal findings: Secondary | ICD-10-CM | POA: Diagnosis not present

## 2015-02-26 DIAGNOSIS — I10 Essential (primary) hypertension: Secondary | ICD-10-CM

## 2015-02-26 DIAGNOSIS — D72819 Decreased white blood cell count, unspecified: Secondary | ICD-10-CM

## 2015-02-26 DIAGNOSIS — Z01419 Encounter for gynecological examination (general) (routine) without abnormal findings: Secondary | ICD-10-CM | POA: Diagnosis not present

## 2015-02-26 DIAGNOSIS — Z1151 Encounter for screening for human papillomavirus (HPV): Secondary | ICD-10-CM | POA: Diagnosis present

## 2015-02-26 DIAGNOSIS — Z1239 Encounter for other screening for malignant neoplasm of breast: Secondary | ICD-10-CM

## 2015-02-26 DIAGNOSIS — F439 Reaction to severe stress, unspecified: Secondary | ICD-10-CM

## 2015-02-26 MED ORDER — CLOTRIMAZOLE-BETAMETHASONE 1-0.05 % EX CREA: 1.0000 "application " | TOPICAL_CREAM | Freq: Two times a day (BID) | CUTANEOUS | Status: DC

## 2015-02-26 NOTE — Progress Notes (Signed)
Patient ID: Melinda Barber, female   DOB: 24-Dec-1955, 59 y.o.   MRN: [id number]   Subjective:    Patient ID: Melinda Barber, female    DOB: May 02, 1956, 59 y.o.   MRN: [id number]  HPI  Patient here to follow up on her current medical issues as well as for her physical exam.  Increased stress with some family issues.  Does not feel she needs anything more at this point.  Eating and drinking well.  Tries to stay active.  No cardiac symptoms with increased activity or exertion.  Breathing stable.  Bowels stable.     Past Medical History  Diagnosis Date  . Hypertension   . Hypercholesterolemia     Current Outpatient Prescriptions on File Prior to Visit  Medication Sig Dispense Refill  . amLODipine (NORVASC) 5 MG tablet TAKE 1 TABLET (5 MG TOTAL) BY MOUTH DAILY. 90 tablet 1  . losartan-hydrochlorothiazide (HYZAAR) 100-25 MG per tablet TAKE 1 TABLET BY MOUTH DAILY. 90 tablet 3  . metoprolol succinate (TOPROL-XL) 25 MG 24 hr tablet TAKE 1 TABLET (25 MG TOTAL) BY MOUTH DAILY. 90 tablet 3  . Multiple Vitamin (MULTIVITAMIN) capsule Take 1 capsule by mouth daily.    . vitamin E 400 UNIT capsule Take 400 Units by mouth daily.     No current facility-administered medications on file prior to visit.    Review of Systems  Constitutional: Negative for appetite change and unexpected weight change.  HENT: Negative for congestion and sinus pressure.   Eyes: Negative for pain and visual disturbance.  Respiratory: Negative for cough, chest tightness and shortness of breath.   Cardiovascular: Negative for chest pain, palpitations and leg swelling.  Gastrointestinal: Negative for nausea, vomiting, abdominal pain and diarrhea.  Genitourinary: Negative for frequency and difficulty urinating.  Musculoskeletal: Negative for back pain and joint swelling.  Skin: Negative for color change and rash.  Neurological: Negative for dizziness, light-headedness and headaches.  Hematological: Negative for adenopathy. Does  not bruise/bleed easily.  Psychiatric/Behavioral: Negative for dysphoric mood and agitation.       Objective:    Physical Exam  Constitutional: She is oriented to person, place, and time. She appears well-developed and well-nourished.  HENT:  Nose: Nose normal.  Mouth/Throat: Oropharynx is clear and moist.  Eyes: Right eye exhibits no discharge. Left eye exhibits no discharge. No scleral icterus.  Neck: Neck supple. No thyromegaly present.  Cardiovascular: Normal rate and regular rhythm.   Pulmonary/Chest: Breath sounds normal. No accessory muscle usage. No tachypnea. No respiratory distress. She has no decreased breath sounds. She has no wheezes. She has no rhonchi. Right breast exhibits no inverted nipple, no mass, no nipple discharge and no tenderness (no axillary adenopathy). Left breast exhibits no inverted nipple, no mass, no nipple discharge and no tenderness (no axilarry adenopathy).  Abdominal: Soft. Bowel sounds are normal. There is no tenderness.  Genitourinary:  Normal external genitalia.  Vaginal vault without lesions.  Cervix identified.  Pap smear performed.  Could not appreciate any adnexal masses or tenderness.    Musculoskeletal: She exhibits no edema or tenderness.  Lymphadenopathy:    She has no cervical adenopathy.  Neurological: She is alert and oriented to person, place, and time.  Skin: Skin is warm. No rash noted.  Psychiatric: She has a normal mood and affect. Her behavior is normal.    BP 130/80 mmHg  Pulse 71  Temp(Src) 98.1 F (36.7 C) (Oral)  Ht 5' 3.25" (1.607 m)  Wt 131 lb 4 oz (59.535  kg)  BMI 23.05 kg/m2  SpO2 96%  LMP 06/28/2006 Wt Readings from Last 3 Encounters:  02/26/15 131 lb 4 oz (59.535 kg)  11/06/14 136 lb 8 oz (61.916 kg)  03/18/14 133 lb 4 oz (60.442 kg)     Lab Results  Component Value Date   WBC 5.3 11/13/2014   HGB 14.3 11/13/2014   HCT 41.8 11/13/2014   PLT 224.0 11/13/2014   GLUCOSE 100* 11/13/2014   CHOL 171  11/13/2014   TRIG 125.0 11/13/2014   HDL 53.50 11/13/2014   LDLDIRECT 143.8 03/18/2013   LDLCALC 93 11/13/2014   ALT 26 11/13/2014   AST 25 11/13/2014   NA 137 11/13/2014   K 4.1 11/13/2014   CL 101 11/13/2014   CREATININE 0.78 11/13/2014   BUN 16 11/13/2014   CO2 31 11/13/2014   TSH 0.98 11/13/2014   HGBA1C 5.5 11/13/2014       Assessment & Plan:   Problem List Items Addressed This Visit    Health care maintenance    Physical 02/26/15 - today.  Mammogram 04/22/14 - Birads I.  Schedule f/u mammogram.  Colonoscopy 06/2014.        Hyperbilirubinemia    Recheck liver panel with next albs.        Relevant Orders   Hepatic function panel   Hypercholesterolemia    Low cholesterol diet and exercise.  Follow lipid panel.        Relevant Orders   Lipid panel   Hypertension    Blood pressure doing well.  Same medication regimen.  Follow pressures.  Follow metabolic panel.       Relevant Orders   Basic metabolic panel   Leukopenia    White count 11/13/14 - wnl.        Stress    Increased stress as outlined.  Desired no further intervention at this time.  Follow.        Other Visit Diagnoses    Breast cancer screening    -  Primary    Relevant Orders    MM DIGITAL SCREENING BILATERAL    Cytology - PAP (Completed)        Dale [place], MD

## 2015-02-26 NOTE — Progress Notes (Signed)
Pre visit review using our clinic review tool, if applicable. No additional management support is needed unless otherwise documented below in the visit note. 

## 2015-03-02 LAB — CYTOLOGY - PAP

## 2015-03-03 ENCOUNTER — Encounter: Payer: Self-pay | Admitting: Internal Medicine

## 2015-03-05 NOTE — Telephone Encounter (Signed)
Unread mychart message mailed to patient 

## 2015-03-09 ENCOUNTER — Encounter: Payer: Self-pay | Admitting: Internal Medicine

## 2015-03-09 DIAGNOSIS — F439 Reaction to severe stress, unspecified: Secondary | ICD-10-CM | POA: Insufficient documentation

## 2015-03-09 NOTE — Assessment & Plan Note (Signed)
Physical 02/26/15 - today.  Mammogram 04/22/14 - Birads I.  Schedule f/u mammogram.  Colonoscopy 06/2014.

## 2015-03-09 NOTE — Assessment & Plan Note (Signed)
White count 11/13/14 - wnl.

## 2015-03-09 NOTE — Assessment & Plan Note (Signed)
Recheck liver panel with next albs.

## 2015-03-09 NOTE — Assessment & Plan Note (Signed)
Blood pressure doing well.  Same medication regimen.  Follow pressures.  Follow metabolic panel.   

## 2015-03-09 NOTE — Assessment & Plan Note (Signed)
Increased stress as outlined.  Desired no further intervention at this time.  Follow.

## 2015-03-09 NOTE — Assessment & Plan Note (Signed)
Low cholesterol diet and exercise.  Follow lipid panel.   

## 2015-04-06 ENCOUNTER — Other Ambulatory Visit: Payer: Self-pay | Admitting: Internal Medicine

## 2015-04-07 ENCOUNTER — Encounter: Payer: Self-pay | Admitting: Internal Medicine

## 2015-04-28 ENCOUNTER — Ambulatory Visit
Admission: RE | Admit: 2015-04-28 | Discharge: 2015-04-28 | Disposition: A | Discharge status: Home / Self Care | Payer: BC Managed Care – PPO | Source: Ambulatory Visit | Attending: Internal Medicine | Admitting: Internal Medicine

## 2015-04-28 DIAGNOSIS — Z1231 Encounter for screening mammogram for malignant neoplasm of breast: Secondary | ICD-10-CM | POA: Insufficient documentation

## 2015-04-28 DIAGNOSIS — Z1239 Encounter for other screening for malignant neoplasm of breast: Secondary | ICD-10-CM

## 2015-05-04 ENCOUNTER — Other Ambulatory Visit: Payer: Self-pay | Admitting: Internal Medicine

## 2015-06-16 ENCOUNTER — Other Ambulatory Visit: Payer: BC Managed Care – PPO

## 2015-06-16 ENCOUNTER — Encounter: Payer: Self-pay | Admitting: Internal Medicine

## 2015-09-10 ENCOUNTER — Encounter: Payer: Self-pay | Admitting: Internal Medicine

## 2015-09-10 ENCOUNTER — Ambulatory Visit (INDEPENDENT_AMBULATORY_CARE_PROVIDER_SITE_OTHER): Payer: BC Managed Care – PPO | Admitting: Internal Medicine

## 2015-09-10 VITALS — BP 138/100 | HR 64 | Temp 98.3°F | Resp 18 | Ht 63.25 in | Wt 133.8 lb

## 2015-09-10 DIAGNOSIS — Z658 Other specified problems related to psychosocial circumstances: Secondary | ICD-10-CM

## 2015-09-10 DIAGNOSIS — E7800 Pure hypercholesterolemia, unspecified: Secondary | ICD-10-CM | POA: Diagnosis not present

## 2015-09-10 DIAGNOSIS — I10 Essential (primary) hypertension: Secondary | ICD-10-CM

## 2015-09-10 DIAGNOSIS — F439 Reaction to severe stress, unspecified: Secondary | ICD-10-CM

## 2015-09-10 NOTE — Progress Notes (Signed)
Patient ID: Melinda Barber, female   DOB: [birth year], 59 y.o.   MRN: [id number]   Subjective:    Patient ID: Melinda Barber, female    DOB: [birth year], 59 y.o.   MRN: [id number]  HPI  Patient with past history of hypercholesterolemia and hypertension.  She comes in today to follow up on these issues.  She reports she is doing relatively well.  Stays active.  Increased stress.  Feels she is handling things relatively well.  No chest pain or tightness.  No sob.  No acid reflux.  No abdominal pain or cramping.  Bowels stable.     Past Medical History  Diagnosis Date  . Hypertension   . Hypercholesterolemia    No past surgical history on file. Family History  Problem Relation Age of Onset  . Hypertension Mother   . Hypercholesterolemia Mother   . Diabetes Mother   . Transient ischemic attack Mother   . Aneurysm Father   . Hypertension Brother   . Aneurysm Brother     heart  . Colon cancer Neg Hx   . Breast cancer Other 98   Social History   Social History  . Marital Status: Married    Spouse Name: N/A  . Number of Children: 3  . Years of Education: N/A   Social History Main Topics  . Smoking status: Former Smoker    Quit date: 09/20/1991  . Smokeless tobacco: Never Used  . Alcohol Use: 0.0 oz/week    0 Standard drinks or equivalent per week  . Drug Use: No  . Sexual Activity: Not Asked   Other Topics Concern  . None   Social History Narrative    Outpatient Encounter Prescriptions as of 09/10/2015  Medication Sig  . amLODipine (NORVASC) 5 MG tablet TAKE 1 TABLET (5 MG TOTAL) BY MOUTH DAILY.  . clotrimazole-betamethasone (LOTRISONE) cream Apply 1 application topically 2 (two) times daily.  Marland Kitchen losartan-hydrochlorothiazide (HYZAAR) 100-25 MG per tablet TAKE 1 TABLET BY MOUTH DAILY.  . metoprolol succinate (TOPROL-XL) 25 MG 24 hr tablet TAKE 1 TABLET (25 MG TOTAL) BY MOUTH DAILY.  . Multiple Vitamin (MULTIVITAMIN) capsule Take 1 capsule by mouth daily.  . vitamin E 400  UNIT capsule Take 400 Units by mouth daily.   No facility-administered encounter medications on file as of 09/10/2015.    Review of Systems  Constitutional: Negative for appetite change and unexpected weight change.  HENT: Negative for congestion and sinus pressure.   Respiratory: Negative for cough, chest tightness and shortness of breath.   Cardiovascular: Negative for chest pain, palpitations and leg swelling.  Gastrointestinal: Negative for nausea, vomiting, abdominal pain and diarrhea.  Genitourinary: Negative for dysuria and difficulty urinating.  Musculoskeletal: Negative for back pain and joint swelling.  Skin: Negative for color change and rash.  Neurological: Negative for dizziness, light-headedness and headaches.  Psychiatric/Behavioral: Negative for dysphoric mood and agitation.       Objective:     Blood pressure rechecked by me:  138/82-94  Physical Exam  Constitutional: She appears well-developed and well-nourished. No distress.  HENT:  Nose: Nose normal.  Mouth/Throat: Oropharynx is clear and moist.  Neck: Neck supple. No thyromegaly present.  Cardiovascular: Normal rate and regular rhythm.   Pulmonary/Chest: Breath sounds normal. No respiratory distress. She has no wheezes.  Abdominal: Soft. Bowel sounds are normal. There is no tenderness.  Musculoskeletal: She exhibits no edema or tenderness.  Lymphadenopathy:    She has no cervical adenopathy.  Skin: No rash noted. No  erythema.  Psychiatric: She has a normal mood and affect. Her behavior is normal.    BP 138/100 mmHg  Pulse 64  Temp(Src) 98.3 F (36.8 C) (Oral)  Resp 18  Ht 5' 3.25" (1.607 m)  Wt 133 lb 12 oz (60.669 kg)  BMI 23.49 kg/m2  SpO2 98%  LMP 06/28/2006 Wt Readings from Last 3 Encounters:  09/10/15 133 lb 12 oz (60.669 kg)  02/26/15 131 lb 4 oz (59.535 kg)  11/06/14 136 lb 8 oz (61.916 kg)     Lab Results  Component Value Date   WBC 5.3 11/13/2014   HGB 14.3 11/13/2014   HCT  41.8 11/13/2014   PLT 224.0 11/13/2014   GLUCOSE 100* 11/13/2014   CHOL 171 11/13/2014   TRIG 125.0 11/13/2014   HDL 53.50 11/13/2014   LDLDIRECT 143.8 03/18/2013   LDLCALC 93 11/13/2014   ALT 26 11/13/2014   AST 25 11/13/2014   NA 137 11/13/2014   K 4.1 11/13/2014   CL 101 11/13/2014   CREATININE 0.78 11/13/2014   BUN 16 11/13/2014   CO2 31 11/13/2014   TSH 0.98 11/13/2014   HGBA1C 5.5 11/13/2014    Mm Digital Screening Bilateral  04/29/2015  CLINICAL DATA:  Screening. EXAM: DIGITAL SCREENING BILATERAL MAMMOGRAM WITH CAD COMPARISON:  Previous exam(s). ACR Breast Density Category b: There are scattered areas of fibroglandular density. FINDINGS: There are no findings suspicious for malignancy. Images were processed with CAD. IMPRESSION: No mammographic evidence of malignancy. A result letter of this screening mammogram will be mailed directly to the patient. RECOMMENDATION: Screening mammogram in one year. (Code:SM-B-01Y) BI-RADS CATEGORY  1: Negative. Electronically Signed   By: Altamese Cabal M.D.   On: 04/29/2015 08:06       Assessment & Plan:   Problem List Items Addressed This Visit    Hypercholesterolemia    Low cholesterol diet and exercise.  Follow lipid panel.        Hypertension - Primary    Blood pressure elevated today.  She reports has been better.  Hold on changing medication.  Have her spot check her pressure.  Get her back in soon to reassess.  Follow met b and a1c.  Get her back in soon to reassess.        Stress    Discussed with her today.  She desires no further intervention.  Follow.            Einar Pheasant, MD

## 2015-09-10 NOTE — Patient Instructions (Signed)
Saline nasal spray - flush nose at least 2-3x/day  nasacort nasal spray - 2 sprays each nostril one time per day.  Do this in the evening.  

## 2015-09-10 NOTE — Progress Notes (Signed)
Pre-visit discussion using our clinic review tool. No additional management support is needed unless otherwise documented below in the visit note.  

## 2015-09-14 ENCOUNTER — Encounter: Payer: Self-pay | Admitting: Internal Medicine

## 2015-09-14 NOTE — Assessment & Plan Note (Signed)
Blood pressure elevated today.  She reports has been better.  Hold on changing medication.  Have her spot check her pressure.  Get her back in soon to reassess.  Follow met b and a1c.  Get her back in soon to reassess.

## 2015-09-14 NOTE — Assessment & Plan Note (Signed)
Discussed with her today.  She desires no further intervention.  Follow.   

## 2015-09-14 NOTE — Assessment & Plan Note (Signed)
Low cholesterol diet and exercise.  Follow lipid panel.   

## 2015-11-03 ENCOUNTER — Other Ambulatory Visit: Payer: Self-pay | Admitting: Internal Medicine

## 2015-11-16 ENCOUNTER — Ambulatory Visit: Payer: BC Managed Care – PPO | Admitting: Internal Medicine

## 2016-02-04 ENCOUNTER — Encounter: Payer: Self-pay | Admitting: Internal Medicine

## 2016-02-04 ENCOUNTER — Ambulatory Visit (INDEPENDENT_AMBULATORY_CARE_PROVIDER_SITE_OTHER): Payer: BC Managed Care – PPO | Admitting: Internal Medicine

## 2016-02-04 VITALS — BP 122/80 | HR 60 | Temp 98.6°F | Resp 18 | Ht 63.25 in | Wt 133.1 lb

## 2016-02-04 DIAGNOSIS — D72819 Decreased white blood cell count, unspecified: Secondary | ICD-10-CM

## 2016-02-04 DIAGNOSIS — R739 Hyperglycemia, unspecified: Secondary | ICD-10-CM

## 2016-02-04 DIAGNOSIS — E7800 Pure hypercholesterolemia, unspecified: Secondary | ICD-10-CM

## 2016-02-04 DIAGNOSIS — I10 Essential (primary) hypertension: Secondary | ICD-10-CM | POA: Diagnosis not present

## 2016-02-04 DIAGNOSIS — Z658 Other specified problems related to psychosocial circumstances: Secondary | ICD-10-CM | POA: Diagnosis not present

## 2016-02-04 DIAGNOSIS — F439 Reaction to severe stress, unspecified: Secondary | ICD-10-CM

## 2016-02-04 LAB — CBC WITH DIFFERENTIAL/PLATELET
BASOS PCT: 0.5 % (ref 0.0–3.0)
Basophils Absolute: 0 10*3/uL (ref 0.0–0.1)
EOS PCT: 2.3 % (ref 0.0–5.0)
Eosinophils Absolute: 0.1 10*3/uL (ref 0.0–0.7)
HCT: 41.3 % (ref 36.0–46.0)
Hemoglobin: 14.1 g/dL (ref 12.0–15.0)
LYMPHS ABS: 1.7 10*3/uL (ref 0.7–4.0)
Lymphocytes Relative: 28.1 % (ref 12.0–46.0)
MCHC: 34.2 g/dL (ref 30.0–36.0)
MCV: 91.3 fl (ref 78.0–100.0)
MONOS PCT: 7.5 % (ref 3.0–12.0)
Monocytes Absolute: 0.5 10*3/uL (ref 0.1–1.0)
NEUTROS ABS: 3.8 10*3/uL (ref 1.4–7.7)
NEUTROS PCT: 61.6 % (ref 43.0–77.0)
Platelets: 198 10*3/uL (ref 150.0–400.0)
RBC: 4.52 Mil/uL (ref 3.87–5.11)
RDW: 13.2 % (ref 11.5–15.5)
WBC: 6.2 10*3/uL (ref 4.0–10.5)

## 2016-02-04 LAB — TSH: TSH: 1.15 u[IU]/mL (ref 0.35–4.50)

## 2016-02-04 LAB — HEPATIC FUNCTION PANEL
ALBUMIN: 4.5 g/dL (ref 3.5–5.2)
ALK PHOS: 69 U/L (ref 39–117)
ALT: 16 U/L (ref 0–35)
AST: 18 U/L (ref 0–37)
BILIRUBIN TOTAL: 1.4 mg/dL — AB (ref 0.2–1.2)
Bilirubin, Direct: 0.2 mg/dL (ref 0.0–0.3)
Total Protein: 7.1 g/dL (ref 6.0–8.3)

## 2016-02-04 LAB — LIPID PANEL
CHOL/HDL RATIO: 3
Cholesterol: 176 mg/dL (ref 0–200)
HDL: 55.4 mg/dL (ref >39.00)
LDL Cholesterol: 100 mg/dL — ABNORMAL HIGH (ref 0–99)
NONHDL: 120.97
Triglycerides: 103 mg/dL (ref 0.0–149.0)
VLDL: 20.6 mg/dL (ref 0.0–40.0)

## 2016-02-04 LAB — BASIC METABOLIC PANEL
BUN: 18 mg/dL (ref 6–23)
CHLORIDE: 102 meq/L (ref 96–112)
CO2: 31 mEq/L (ref 19–32)
Calcium: 9.5 mg/dL (ref 8.4–10.5)
Creatinine, Ser: 0.74 mg/dL (ref 0.40–1.20)
GFR: 85.74 mL/min (ref >60.00)
Glucose, Bld: 95 mg/dL (ref 70–99)
POTASSIUM: 4 meq/L (ref 3.5–5.1)
Sodium: 139 mEq/L (ref 135–145)

## 2016-02-04 LAB — HEMOGLOBIN A1C: HEMOGLOBIN A1C: 5.5 % (ref 4.6–6.5)

## 2016-02-04 MED ORDER — AMLODIPINE BESYLATE 5 MG PO TABS: ORAL_TABLET | ORAL | Status: DC

## 2016-02-04 NOTE — Progress Notes (Signed)
Pre-visit discussion using our clinic review tool. No additional management support is needed unless otherwise documented below in the visit note.  

## 2016-02-04 NOTE — Assessment & Plan Note (Signed)
Increased stress as outlined.  Feels she is handling things well.  Follow.    

## 2016-02-04 NOTE — Assessment & Plan Note (Signed)
History of leukopenia.  Recheck cbc to confirm normal.

## 2016-02-04 NOTE — Assessment & Plan Note (Signed)
Has been watching her diet.  Check cholesterol panel.

## 2016-02-04 NOTE — Addendum Note (Signed)
Addended by: Montine CircleMALDONADO, Daylyn Christine D on: 02/04/2016 08:38 AM   Modules accepted: Orders

## 2016-02-04 NOTE — Assessment & Plan Note (Signed)
Blood pressure doing well.  Same medication regimen.  Follow pressures.  Check metabolic panel.   

## 2016-02-04 NOTE — Progress Notes (Signed)
Patient ID: Melinda Barber, female   DOB: [birth year], 60 y.o.   MRN: [id number]   Subjective:    Patient ID: Melinda Barber, female    DOB: Apr 29, 1956, 60 y.o.   MRN: [id number]  HPI  Patient here for a scheduled follow up.  Increased stress, but she feels she is handling things well.  No chest pain.  No sob.  No abdominal pain or cramping.  Bowels doing well.  Blood pressure has been averaging 124/78.     Past Medical History  Diagnosis Date  . Hypertension   . Hypercholesterolemia    No past surgical history on file. Family History  Problem Relation Age of Onset  . Hypertension Mother   . Hypercholesterolemia Mother   . Diabetes Mother   . Transient ischemic attack Mother   . Aneurysm Father   . Hypertension Brother   . Aneurysm Brother     heart  . Colon cancer Neg Hx   . Breast cancer Other 46   Social History   Social History  . Marital Status: Married    Spouse Name: N/A  . Number of Children: 3  . Years of Education: N/A   Social History Main Topics  . Smoking status: Former Smoker    Quit date: 09/20/1991  . Smokeless tobacco: Never Used  . Alcohol Use: 0.0 oz/week    0 Standard drinks or equivalent per week  . Drug Use: No  . Sexual Activity: Not Asked   Other Topics Concern  . None   Social History Narrative    Outpatient Encounter Prescriptions as of 02/04/2016  Medication Sig  . amLODipine (NORVASC) 5 MG tablet TAKE 1 TABLET (5 MG TOTAL) BY MOUTH DAILY.  . clotrimazole-betamethasone (LOTRISONE) cream Apply 1 application topically 2 (two) times daily.  Marland Kitchen losartan-hydrochlorothiazide (HYZAAR) 100-25 MG per tablet TAKE 1 TABLET BY MOUTH DAILY.  . metoprolol succinate (TOPROL-XL) 25 MG 24 hr tablet TAKE 1 TABLET (25 MG TOTAL) BY MOUTH DAILY.  . Multiple Vitamin (MULTIVITAMIN) capsule Take 1 capsule by mouth daily.  . vitamin E 400 UNIT capsule Take 400 Units by mouth daily.  . [DISCONTINUED] amLODipine (NORVASC) 5 MG tablet TAKE 1 TABLET (5 MG TOTAL) BY  MOUTH DAILY.   No facility-administered encounter medications on file as of 02/04/2016.    Review of Systems  Constitutional: Negative for appetite change and unexpected weight change.  HENT: Negative for congestion and sinus pressure.   Respiratory: Negative for cough, chest tightness and shortness of breath.   Cardiovascular: Negative for chest pain, palpitations and leg swelling.  Gastrointestinal: Negative for nausea, vomiting, abdominal pain and diarrhea.  Skin: Negative for color change and rash.  Neurological: Negative for dizziness and light-headedness.  Psychiatric/Behavioral: Negative for dysphoric mood and agitation.       Increased stress as outlined.        Objective:     Blood pressure rechecked by me:  134/78  Physical Exam  Constitutional: She appears well-developed and well-nourished. No distress.  HENT:  Nose: Nose normal.  Mouth/Throat: Oropharynx is clear and moist.  Neck: Neck supple. No thyromegaly present.  Cardiovascular: Normal rate and regular rhythm.   Pulmonary/Chest: Breath sounds normal. No respiratory distress. She has no wheezes.  Abdominal: Soft. Bowel sounds are normal. There is no tenderness.  Musculoskeletal: She exhibits no edema or tenderness.  Lymphadenopathy:    She has no cervical adenopathy.  Skin: No rash noted. No erythema.  Psychiatric: She has a normal mood and affect. Her  behavior is normal.    BP 122/80 mmHg  Pulse 60  Temp(Src) 98.6 F (37 C) (Oral)  Resp 18  Ht 5' 3.25" (1.607 m)  Wt 133 lb 2 oz (60.385 kg)  BMI 23.38 kg/m2  SpO2 97%  LMP 06/28/2006 Wt Readings from Last 3 Encounters:  02/04/16 133 lb 2 oz (60.385 kg)  09/10/15 133 lb 12 oz (60.669 kg)  02/26/15 131 lb 4 oz (59.535 kg)     Lab Results  Component Value Date   WBC 5.3 11/13/2014   HGB 14.3 11/13/2014   HCT 41.8 11/13/2014   PLT 224.0 11/13/2014   GLUCOSE 100* 11/13/2014   CHOL 171 11/13/2014   TRIG 125.0 11/13/2014   HDL 53.50 11/13/2014    LDLDIRECT 143.8 03/18/2013   LDLCALC 93 11/13/2014   ALT 26 11/13/2014   AST 25 11/13/2014   NA 137 11/13/2014   K 4.1 11/13/2014   CL 101 11/13/2014   CREATININE 0.78 11/13/2014   BUN 16 11/13/2014   CO2 31 11/13/2014   TSH 0.98 11/13/2014   HGBA1C 5.5 11/13/2014    Mm Digital Screening Bilateral  04/29/2015  CLINICAL DATA:  Screening. EXAM: DIGITAL SCREENING BILATERAL MAMMOGRAM WITH CAD COMPARISON:  Previous exam(s). ACR Breast Density Category b: There are scattered areas of fibroglandular density. FINDINGS: There are no findings suspicious for malignancy. Images were processed with CAD. IMPRESSION: No mammographic evidence of malignancy. A result letter of this screening mammogram will be mailed directly to the patient. RECOMMENDATION: Screening mammogram in one year. (Code:SM-B-01Y) BI-RADS CATEGORY  1: Negative. Electronically Signed   By: Rolla Plateandolph  Jackson M.D.   On: 04/29/2015 08:06       Assessment & Plan:   Problem List Items Addressed This Visit    Hypercholesterolemia    Has been watching her diet.  Check cholesterol panel.       Relevant Medications   amLODipine (NORVASC) 5 MG tablet   Other Relevant Orders   Lipid panel   Hepatic function panel   Hypertension    Blood pressure doing well.  Same medication regimen.  Follow pressures.  Check metabolic panel.       Relevant Medications   amLODipine (NORVASC) 5 MG tablet   Other Relevant Orders   CBC with Differential/Platelet   TSH   Basic metabolic panel   Leukopenia    History of leukopenia.  Recheck cbc to confirm normal.       Stress - Primary    Increased stress as outlined.  Feels she is handling things well.  Follow.         Other Visit Diagnoses    Hyperglycemia        Relevant Orders    Hemoglobin A1c        Dale DurhamSCOTT, Seleen Walter, MD

## 2016-02-08 ENCOUNTER — Encounter: Payer: Self-pay | Admitting: Internal Medicine

## 2016-02-11 NOTE — Telephone Encounter (Signed)
Unread mychart message mailed to patient 

## 2016-03-21 ENCOUNTER — Other Ambulatory Visit: Payer: Self-pay | Admitting: Internal Medicine

## 2016-03-21 DIAGNOSIS — Z1231 Encounter for screening mammogram for malignant neoplasm of breast: Secondary | ICD-10-CM

## 2016-04-01 ENCOUNTER — Other Ambulatory Visit: Payer: Self-pay | Admitting: Internal Medicine

## 2016-05-02 ENCOUNTER — Ambulatory Visit
Admission: RE | Admit: 2016-05-02 | Discharge: 2016-05-02 | Disposition: A | Discharge status: Home / Self Care | Payer: BC Managed Care – PPO | Source: Ambulatory Visit | Attending: Internal Medicine | Admitting: Internal Medicine

## 2016-05-02 ENCOUNTER — Other Ambulatory Visit: Payer: Self-pay | Admitting: Internal Medicine

## 2016-05-02 DIAGNOSIS — Z1231 Encounter for screening mammogram for malignant neoplasm of breast: Secondary | ICD-10-CM | POA: Diagnosis not present

## 2016-08-08 ENCOUNTER — Encounter: Payer: Self-pay | Admitting: Internal Medicine

## 2016-08-08 ENCOUNTER — Ambulatory Visit (INDEPENDENT_AMBULATORY_CARE_PROVIDER_SITE_OTHER): Payer: BC Managed Care – PPO | Admitting: Internal Medicine

## 2016-08-08 VITALS — BP 158/90 | HR 68 | Temp 98.1°F | Ht 63.0 in | Wt 139.4 lb

## 2016-08-08 DIAGNOSIS — I10 Essential (primary) hypertension: Secondary | ICD-10-CM

## 2016-08-08 DIAGNOSIS — E7800 Pure hypercholesterolemia, unspecified: Secondary | ICD-10-CM

## 2016-08-08 DIAGNOSIS — R739 Hyperglycemia, unspecified: Secondary | ICD-10-CM | POA: Diagnosis not present

## 2016-08-08 DIAGNOSIS — F439 Reaction to severe stress, unspecified: Secondary | ICD-10-CM

## 2016-08-08 LAB — LIPID PANEL
Cholesterol: 205 mg/dL — ABNORMAL HIGH (ref 0–200)
HDL: 68.1 mg/dL (ref >39.00)
LDL Cholesterol: 116 mg/dL — ABNORMAL HIGH (ref 0–99)
NONHDL: 137.27
TRIGLYCERIDES: 106 mg/dL (ref 0.0–149.0)
Total CHOL/HDL Ratio: 3
VLDL: 21.2 mg/dL (ref 0.0–40.0)

## 2016-08-08 LAB — HEMOGLOBIN A1C: Hgb A1c MFr Bld: 5.3 % (ref 4.6–6.5)

## 2016-08-08 LAB — HEPATIC FUNCTION PANEL
ALBUMIN: 4.6 g/dL (ref 3.5–5.2)
ALK PHOS: 66 U/L (ref 39–117)
ALT: 17 U/L (ref 0–35)
AST: 18 U/L (ref 0–37)
BILIRUBIN DIRECT: 0.2 mg/dL (ref 0.0–0.3)
TOTAL PROTEIN: 6.9 g/dL (ref 6.0–8.3)
Total Bilirubin: 1.5 mg/dL — ABNORMAL HIGH (ref 0.2–1.2)

## 2016-08-08 LAB — BASIC METABOLIC PANEL
BUN: 21 mg/dL (ref 6–23)
CHLORIDE: 102 meq/L (ref 96–112)
CO2: 31 mEq/L (ref 19–32)
CREATININE: 0.8 mg/dL (ref 0.40–1.20)
Calcium: 9.7 mg/dL (ref 8.4–10.5)
GFR: 78.22 mL/min (ref >60.00)
GLUCOSE: 100 mg/dL — AB (ref 70–99)
Potassium: 4.5 mEq/L (ref 3.5–5.1)
Sodium: 140 mEq/L (ref 135–145)

## 2016-08-08 NOTE — Progress Notes (Signed)
Pre visit review using our clinic review tool, if applicable. No additional management support is needed unless otherwise documented below in the visit note. 

## 2016-08-08 NOTE — Progress Notes (Signed)
Patient ID: Melinda Barber, female   DOB: 08/21/1956, 60 y.o.   MRN: [id number]   Subjective:    Patient ID: Melinda Barber, female    DOB: 08/16/1956, 60 y.o.   MRN: [id number]  HPI  Patient here for a scheduled follow up.  She reports she is doing relatively well.  Seeing dermatology for skin cancer removal.  Taking Vitamin B3 - prescribed by dermatology.  Stays active.  No chest pain.  No sob.  No acid reflux.  No abdominal pain or cramping.  Bowels stable.  She reports her blood pressure has been averaging 128/80-84.  Her highest reading 137/90.     Past Medical History:  Diagnosis Date  . Hypercholesterolemia   . Hypertension    History reviewed. No pertinent surgical history. Family History  Problem Relation Age of Onset  . Breast cancer Other 47  . Hypertension Mother   . Hypercholesterolemia Mother   . Diabetes Mother   . Transient ischemic attack Mother   . Aneurysm Father   . Hypertension Brother   . Aneurysm Brother     heart  . Colon cancer Neg Hx    Social History   Social History  . Marital status: Married    Spouse name: N/A  . Number of children: 3  . Years of education: N/A   Social History Main Topics  . Smoking status: Former Smoker    Quit date: 09/20/1991  . Smokeless tobacco: Never Used  . Alcohol use 0.0 oz/week  . Drug use: No  . Sexual activity: Not Asked   Other Topics Concern  . None   Social History Narrative  . None    Outpatient Encounter Prescriptions as of 08/08/2016  Medication Sig  . amLODipine (NORVASC) 5 MG tablet TAKE 1 TABLET (5 MG TOTAL) BY MOUTH DAILY.  . clotrimazole-betamethasone (LOTRISONE) cream Apply 1 application topically 2 (two) times daily.  Marland Kitchen. losartan-hydrochlorothiazide (HYZAAR) 100-25 MG tablet TAKE 1 TABLET BY MOUTH DAILY.  . metoprolol succinate (TOPROL-XL) 25 MG 24 hr tablet TAKE 1 TABLET (25 MG TOTAL) BY MOUTH DAILY.  . Multiple Vitamin (MULTIVITAMIN) capsule Take 1 capsule by mouth daily.  . vitamin E 400  UNIT capsule Take 400 Units by mouth daily.   No facility-administered encounter medications on file as of 08/08/2016.     Review of Systems  Constitutional: Negative for appetite change and unexpected weight change.  HENT: Negative for congestion and sinus pressure.   Respiratory: Negative for cough, chest tightness and shortness of breath.   Cardiovascular: Negative for chest pain, palpitations and leg swelling.  Gastrointestinal: Negative for abdominal pain, diarrhea, nausea and vomiting.  Genitourinary: Negative for difficulty urinating and dysuria.  Musculoskeletal: Negative for back pain and joint swelling.  Skin: Negative for color change and rash.  Neurological: Negative for dizziness, light-headedness and headaches.  Psychiatric/Behavioral: Negative for agitation and dysphoric mood.       Objective:     Blood pressure rechecked by me:  138/84  Physical Exam  Constitutional: She appears well-developed and well-nourished. No distress.  HENT:  Nose: Nose normal.  Mouth/Throat: Oropharynx is clear and moist.  Neck: Neck supple. No thyromegaly present.  Cardiovascular: Normal rate and regular rhythm.   Pulmonary/Chest: Breath sounds normal. No respiratory distress. She has no wheezes.  Abdominal: Soft. Bowel sounds are normal. There is no tenderness.  Musculoskeletal: She exhibits no edema or tenderness.  Lymphadenopathy:    She has no cervical adenopathy.  Skin: No rash noted. No erythema.  Psychiatric: She has a normal mood and affect. Her behavior is normal.    BP (!) 158/90   Pulse 68   Temp 98.1 F (36.7 C) (Oral)   Ht 5\' 3"  (1.6 m)   Wt 139 lb 6.4 oz (63.2 kg)   LMP 06/28/2006   SpO2 98%   BMI 24.69 kg/m  Wt Readings from Last 3 Encounters:  08/08/16 139 lb 6.4 oz (63.2 kg)  02/04/16 133 lb 2 oz (60.4 kg)  09/10/15 133 lb 12 oz (60.7 kg)     Lab Results  Component Value Date   WBC 6.2 02/04/2016   HGB 14.1 02/04/2016   HCT 41.3 02/04/2016   PLT  198.0 02/04/2016   GLUCOSE 100 (H) 08/08/2016   CHOL 205 (H) 08/08/2016   TRIG 106.0 08/08/2016   HDL 68.10 08/08/2016   LDLDIRECT 143.8 03/18/2013   LDLCALC 116 (H) 08/08/2016   ALT 17 08/08/2016   AST 18 08/08/2016   NA 140 08/08/2016   K 4.5 08/08/2016   CL 102 08/08/2016   CREATININE 0.80 08/08/2016   BUN 21 08/08/2016   CO2 31 08/08/2016   TSH 1.15 02/04/2016   HGBA1C 5.3 08/08/2016    Mm Screening Breast Tomo Bilateral  Result Date: 05/03/2016 CLINICAL DATA:  Screening. EXAM: 2D DIGITAL SCREENING BILATERAL MAMMOGRAM WITH CAD AND ADJUNCT TOMO COMPARISON:  Previous exam(s). ACR Breast Density Category b: There are scattered areas of fibroglandular density. FINDINGS: There are no findings suspicious for malignancy. Images were processed with CAD. IMPRESSION: No mammographic evidence of malignancy. A result letter of this screening mammogram will be mailed directly to the patient. RECOMMENDATION: Screening mammogram in one year. (Code:SM-B-01Y) BI-RADS CATEGORY  1: Negative. Electronically Signed   By: Dalphine HandingErin  Shaw M.D.   On: 05/03/2016 18:05       Assessment & Plan:   Problem List Items Addressed This Visit    Hyperbilirubinemia    Has been stable.  Currently asymptomatic.  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  Recheck liver panel.        Hypercholesterolemia    Low cholesterol diet and exercise.  Follow lipid panel.        Relevant Orders   Lipid panel (Completed)   Hepatic function panel (Completed)   Hypertension - Primary    Blood pressure elevated on initial check.  Her checks are better.  Recheck here improved.  Same medication regimen.  Follow pressures.  Follow metabolic panel.        Relevant Orders   Basic metabolic panel (Completed)   Stress    Handling stress.  Does not feel needs anything more.  Follow.         Other Visit Diagnoses    Hyperglycemia       Relevant Orders   Hemoglobin A1c (Completed)       Dale DurhamSCOTT, Azharia Surratt, MD

## 2016-08-10 ENCOUNTER — Encounter: Payer: Self-pay | Admitting: Internal Medicine

## 2016-08-14 ENCOUNTER — Encounter: Payer: Self-pay | Admitting: Internal Medicine

## 2016-08-14 NOTE — Assessment & Plan Note (Signed)
Blood pressure elevated on initial check.  Her checks are better.  Recheck here improved.  Same medication regimen.  Follow pressures.  Follow metabolic panel.

## 2016-08-14 NOTE — Assessment & Plan Note (Signed)
Has been stable.  Currently asymptomatic.  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  Recheck liver panel.

## 2016-08-14 NOTE — Assessment & Plan Note (Signed)
Low cholesterol diet and exercise.  Follow lipid panel.   

## 2016-08-14 NOTE — Assessment & Plan Note (Signed)
Handling stress.  Does not feel needs anything more.  Follow.

## 2016-10-20 ENCOUNTER — Encounter: Payer: Self-pay | Admitting: Internal Medicine

## 2016-10-20 ENCOUNTER — Ambulatory Visit (INDEPENDENT_AMBULATORY_CARE_PROVIDER_SITE_OTHER): Payer: BC Managed Care – PPO | Admitting: Internal Medicine

## 2016-10-20 VITALS — BP 144/82 | HR 59 | Temp 98.4°F | Ht 63.0 in | Wt 140.0 lb

## 2016-10-20 DIAGNOSIS — R0981 Nasal congestion: Secondary | ICD-10-CM

## 2016-10-20 DIAGNOSIS — F439 Reaction to severe stress, unspecified: Secondary | ICD-10-CM

## 2016-10-20 DIAGNOSIS — I10 Essential (primary) hypertension: Secondary | ICD-10-CM | POA: Diagnosis not present

## 2016-10-20 DIAGNOSIS — E7800 Pure hypercholesterolemia, unspecified: Secondary | ICD-10-CM | POA: Diagnosis not present

## 2016-10-20 MED ORDER — AMLODIPINE BESYLATE 10 MG PO TABS: 10.0000 mg | ORAL_TABLET | Freq: Every day | ORAL | 3 refills | Status: DC

## 2016-10-20 NOTE — Progress Notes (Signed)
Pre-visit discussion using our clinic review tool. No additional management support is needed unless otherwise documented below in the visit note.  

## 2016-10-20 NOTE — Progress Notes (Signed)
Patient ID: Melinda Barber, female   DOB: Jan 18, 1956, 61 y.o.   MRN: [id number]   Subjective:    Patient ID: Melinda Barber, female    DOB: 1956-01-11, 61 y.o.   MRN: [id number]  HPI  Patient here for a scheduled follow up.  States she is doing well.  Stays active.  No chest pain.  Breathing stable.  No acid reflux.  No abdominal pain or cramping.  Bowels stable.  Handling stress.  Stress is better.  She does report noticing some previous squishing in her right ear.  No pain.  No change in hearing.  Some sinus pressure with yellow mucus production.  No chest congestion.  No cough.  States her blood pressure has been averaging 138/84-88.    Past Medical History:  Diagnosis Date  . Hypercholesterolemia   . Hypertension    History reviewed. No pertinent surgical history. Family History  Problem Relation Age of Onset  . Breast cancer Other 47  . Hypertension Mother   . Hypercholesterolemia Mother   . Diabetes Mother   . Transient ischemic attack Mother   . Aneurysm Father   . Hypertension Brother   . Aneurysm Brother     heart  . Colon cancer Neg Hx    Social History   Social History  . Marital status: Married    Spouse name: N/A  . Number of children: 3  . Years of education: N/A   Social History Main Topics  . Smoking status: Former Smoker    Quit date: 09/20/1991  . Smokeless tobacco: Never Used  . Alcohol use 0.0 oz/week  . Drug use: No  . Sexual activity: Not Asked   Other Topics Concern  . None   Social History Narrative  . None    Outpatient Encounter Prescriptions as of 10/20/2016  Medication Sig  . clotrimazole-betamethasone (LOTRISONE) cream Apply 1 application topically 2 (two) times daily.  Marland Kitchen losartan-hydrochlorothiazide (HYZAAR) 100-25 MG tablet TAKE 1 TABLET BY MOUTH DAILY.  . metoprolol succinate (TOPROL-XL) 25 MG 24 hr tablet TAKE 1 TABLET (25 MG TOTAL) BY MOUTH DAILY.  . Multiple Vitamin (MULTIVITAMIN) capsule Take 1 capsule by mouth daily.  . vitamin E  400 UNIT capsule Take 400 Units by mouth daily.  . [DISCONTINUED] amLODipine (NORVASC) 5 MG tablet TAKE 1 TABLET (5 MG TOTAL) BY MOUTH DAILY.  Marland Kitchen amLODipine (NORVASC) 10 MG tablet Take 1 tablet (10 mg total) by mouth daily.   No facility-administered encounter medications on file as of 10/20/2016.     Review of Systems  Constitutional: Negative for appetite change and unexpected weight change.  HENT: Positive for congestion and sinus pressure.   Respiratory: Negative for cough, chest tightness and shortness of breath.   Cardiovascular: Negative for chest pain, palpitations and leg swelling.  Gastrointestinal: Negative for abdominal pain, diarrhea, nausea and vomiting.  Genitourinary: Negative for difficulty urinating and dysuria.  Musculoskeletal: Negative for back pain and joint swelling.  Skin: Negative for color change and rash.  Neurological: Negative for dizziness, light-headedness and headaches.  Psychiatric/Behavioral: Negative for agitation and dysphoric mood.       Objective:    Physical Exam  Constitutional: She appears well-developed and well-nourished. No distress.  HENT:  Nose: Nose normal.  Mouth/Throat: Oropharynx is clear and moist.  Neck: Neck supple. No thyromegaly present.  Cardiovascular: Normal rate and regular rhythm.   Pulmonary/Chest: Breath sounds normal. No respiratory distress. She has no wheezes.  Abdominal: Soft. Bowel sounds are normal. There is no tenderness.  Musculoskeletal: She exhibits no edema or tenderness.  Lymphadenopathy:    She has no cervical adenopathy.  Skin: No rash noted. No erythema.  Psychiatric: She has a normal mood and affect. Her behavior is normal.    BP (!) 144/82 (BP Location: Left Arm, Patient Position: Sitting, Cuff Size: Large)   Pulse (!) 59   Temp 98.4 F (36.9 C) (Oral)   Ht 5\' 3"  (1.6 m)   Wt 140 lb (63.5 kg)   LMP 06/28/2006   SpO2 98%   BMI 24.80 kg/m  Wt Readings from Last 3 Encounters:  10/20/16 140 lb  (63.5 kg)  08/08/16 139 lb 6.4 oz (63.2 kg)  02/04/16 133 lb 2 oz (60.4 kg)     Lab Results  Component Value Date   WBC 6.2 02/04/2016   HGB 14.1 02/04/2016   HCT 41.3 02/04/2016   PLT 198.0 02/04/2016   GLUCOSE 100 (H) 08/08/2016   CHOL 205 (H) 08/08/2016   TRIG 106.0 08/08/2016   HDL 68.10 08/08/2016   LDLDIRECT 143.8 03/18/2013   LDLCALC 116 (H) 08/08/2016   ALT 17 08/08/2016   AST 18 08/08/2016   NA 140 08/08/2016   K 4.5 08/08/2016   CL 102 08/08/2016   CREATININE 0.80 08/08/2016   BUN 21 08/08/2016   CO2 31 08/08/2016   TSH 1.15 02/04/2016   HGBA1C 5.3 08/08/2016    Mm Screening Breast Tomo Bilateral  Result Date: 05/03/2016 CLINICAL DATA:  Screening. EXAM: 2D DIGITAL SCREENING BILATERAL MAMMOGRAM WITH CAD AND ADJUNCT TOMO COMPARISON:  Previous exam(s). ACR Breast Density Category b: There are scattered areas of fibroglandular density. FINDINGS: There are no findings suspicious for malignancy. Images were processed with CAD. IMPRESSION: No mammographic evidence of malignancy. A result letter of this screening mammogram will be mailed directly to the patient. RECOMMENDATION: Screening mammogram in one year. (Code:SM-B-01Y) BI-RADS CATEGORY  1: Negative. Electronically Signed   By: Dalphine HandingErin  Shaw M.D.   On: 05/03/2016 18:05       Assessment & Plan:   Problem List Items Addressed This Visit    Hypercholesterolemia    Low cholesterol diet and exercise.  Follow lipid panel.        Relevant Medications   amLODipine (NORVASC) 10 MG tablet   Hypertension    Blood pressure remains elevated.  Increased amlodipine to 10mg  q day.  Follow pressures.  Follow metabolic panel.       Relevant Medications   amLODipine (NORVASC) 10 MG tablet   Stress    Overall she feels she is doing better.  Stress better.  Follow.        Other Visit Diagnoses    Congestion of nasal sinus    -  Primary   Saline nasal spray and nasacort as directed.  follow.         Dale DurhamSCOTT, Orell Hurtado,  MD

## 2016-10-23 ENCOUNTER — Encounter: Payer: Self-pay | Admitting: Internal Medicine

## 2016-10-23 NOTE — Assessment & Plan Note (Signed)
Overall she feels she is doing better.  Stress better.  Follow.

## 2016-10-23 NOTE — Assessment & Plan Note (Signed)
Blood pressure remains elevated.  Increased amlodipine to 10mg  q day.  Follow pressures.  Follow metabolic panel.

## 2016-10-23 NOTE — Assessment & Plan Note (Signed)
Low cholesterol diet and exercise.  Follow lipid panel.   

## 2016-11-01 ENCOUNTER — Other Ambulatory Visit: Payer: Self-pay | Admitting: Internal Medicine

## 2016-12-23 ENCOUNTER — Ambulatory Visit (INDEPENDENT_AMBULATORY_CARE_PROVIDER_SITE_OTHER): Payer: BC Managed Care – PPO | Admitting: Internal Medicine

## 2016-12-23 ENCOUNTER — Encounter: Payer: Self-pay | Admitting: Internal Medicine

## 2016-12-23 VITALS — BP 124/87 | HR 83 | Temp 98.3°F | Resp 14 | Ht 63.0 in | Wt 135.2 lb

## 2016-12-23 DIAGNOSIS — E7800 Pure hypercholesterolemia, unspecified: Secondary | ICD-10-CM

## 2016-12-23 DIAGNOSIS — I10 Essential (primary) hypertension: Secondary | ICD-10-CM

## 2016-12-23 DIAGNOSIS — D72819 Decreased white blood cell count, unspecified: Secondary | ICD-10-CM

## 2016-12-23 DIAGNOSIS — F439 Reaction to severe stress, unspecified: Secondary | ICD-10-CM | POA: Diagnosis not present

## 2016-12-23 DIAGNOSIS — R739 Hyperglycemia, unspecified: Secondary | ICD-10-CM | POA: Diagnosis not present

## 2016-12-23 MED ORDER — AMLODIPINE BESYLATE 10 MG PO TABS: 10.0000 mg | ORAL_TABLET | Freq: Every day | ORAL | 3 refills | Status: DC

## 2016-12-23 NOTE — Progress Notes (Signed)
Patient ID: Melinda Barber, female   DOB: 11/05/1955, 61 y.o.   MRN: [id number]   Subjective:    Patient ID: Melinda Barber, female    DOB: 07-01-1956, 61 y.o.   MRN: [id number]  HPI  Patient here for a scheduled follow up.  Here to follow up on her blood pressure.  At her 10/20/16 visit, her amlodipine was increased to  q day.  Her blood pressure is doing better.  Blood pressures have been averaging 112-131/60-70s.  No chest pain.  No sob.  No acid reflux.  No abdominal pain.  Bowels moving.  Handling stress.  Overall feels good.    Past Medical History:  Diagnosis Date  . Hypercholesterolemia   . Hypertension    History reviewed. No pertinent surgical history. Family History  Problem Relation Age of Onset  . Breast cancer Other 47  . Hypertension Mother   . Hypercholesterolemia Mother   . Diabetes Mother   . Transient ischemic attack Mother   . Aneurysm Father   . Hypertension Brother   . Aneurysm Brother     heart  . Colon cancer Neg Hx    Social History   Social History  . Marital status: Married    Spouse name: N/A  . Number of children: 3  . Years of education: N/A   Social History Main Topics  . Smoking status: Former Smoker    Quit date: 09/20/1991  . Smokeless tobacco: Never Used  . Alcohol use 0.0 oz/week  . Drug use: No  . Sexual activity: Not Asked   Other Topics Concern  . None   Social History Narrative  . None    Outpatient Encounter Prescriptions as of 12/23/2016  Medication Sig  . amLODipine (NORVASC) 10 MG tablet Take 1 tablet (10 mg total) by mouth daily.  . clotrimazole-betamethasone (LOTRISONE) cream Apply 1 application topically 2 (two) times daily.  Marland Kitchen losartan-hydrochlorothiazide (HYZAAR) 100-25 MG tablet TAKE 1 TABLET BY MOUTH DAILY.  . metoprolol succinate (TOPROL-XL) 25 MG 24 hr tablet TAKE 1 TABLET (25 MG TOTAL) BY MOUTH DAILY.  . Multiple Vitamin (MULTIVITAMIN) capsule Take 1 capsule by mouth daily.  . [DISCONTINUED] amLODipine  (NORVASC) 10 MG tablet Take 1 tablet (10 mg total) by mouth daily.  . [DISCONTINUED] vitamin E 400 UNIT capsule Take 400 Units by mouth daily.   No facility-administered encounter medications on file as of 12/23/2016.     Review of Systems  Constitutional: Negative for appetite change and unexpected weight change.  HENT: Negative for congestion and sinus pressure.   Respiratory: Negative for cough, chest tightness and shortness of breath.   Cardiovascular: Negative for chest pain, palpitations and leg swelling.  Gastrointestinal: Negative for abdominal pain, diarrhea, nausea and vomiting.  Genitourinary: Negative for difficulty urinating and dysuria.  Musculoskeletal: Negative for back pain and joint swelling.  Skin: Negative for color change and rash.  Neurological: Negative for dizziness, light-headedness and headaches.  Psychiatric/Behavioral: Negative for agitation and dysphoric mood.       Objective:     Blood pressure rechecked by me:  134/84  Physical Exam  Constitutional: She appears well-developed and well-nourished. No distress.  HENT:  Nose: Nose normal.  Mouth/Throat: Oropharynx is clear and moist.  Neck: Neck supple. No thyromegaly present.  Cardiovascular: Normal rate and regular rhythm.   Pulmonary/Chest: Breath sounds normal. No respiratory distress. She has no wheezes.  Abdominal: Soft. Bowel sounds are normal. There is no tenderness.  Musculoskeletal: She exhibits no edema or tenderness.  Lymphadenopathy:    She has no cervical adenopathy.  Skin: No rash noted. No erythema.  Psychiatric: She has a normal mood and affect. Her behavior is normal.    BP 124/87 (BP Location: Left Arm, Patient Position: Sitting, Cuff Size: Normal)   Pulse 83   Temp 98.3 F (36.8 C) (Oral)   Resp 14   Ht  (1.6 m)   Wt 135 lb 3.2 oz (61.3 kg)   LMP 06/28/2006   SpO2 99%   BMI 23.95 kg/m  Wt Readings from Last 3 Encounters:  12/23/16 135 lb 3.2 oz (61.3 kg)  10/20/16  140 lb (63.5 kg)  08/08/16 139 lb 6.4 oz (63.2 kg)     Lab Results  Component Value Date   WBC 6.2 02/04/2016   HGB 14.1 02/04/2016   HCT 41.3 02/04/2016   PLT 198.0 02/04/2016   GLUCOSE 100 (H) 08/08/2016   CHOL 205 (H) 08/08/2016   TRIG 106.0 08/08/2016   HDL 68.10 08/08/2016   LDLDIRECT 143.8 03/18/2013   LDLCALC 116 (H) 08/08/2016   ALT 17 08/08/2016   AST 18 08/08/2016   NA 140 08/08/2016   K 4.5 08/08/2016   CL 102 08/08/2016   CREATININE 0.80 08/08/2016   BUN 21 08/08/2016   CO2 31 08/08/2016   TSH 1.15 02/04/2016   HGBA1C 5.3 08/08/2016    Mm Screening Breast Tomo Bilateral  Result Date: 05/03/2016 CLINICAL DATA:  Screening. EXAM: 2D DIGITAL SCREENING BILATERAL MAMMOGRAM WITH CAD AND ADJUNCT TOMO COMPARISON:  Previous exam(s). ACR Breast Density Category b: There are scattered areas of fibroglandular density. FINDINGS: There are no findings suspicious for malignancy. Images were processed with CAD. IMPRESSION: No mammographic evidence of malignancy. A result letter of this screening mammogram will be mailed directly to the patient. RECOMMENDATION: Screening mammogram in one year. (Code:SM-B-01Y) BI-RADS CATEGORY  1: Negative. Electronically Signed   By: Dalphine Handing M.D.   On: 05/03/2016 18:05       Assessment & Plan:   Problem List Items Addressed This Visit    Hyperbilirubinemia    Follow liver panel.  Will recheck with next labs.  Has been stable.        Hypercholesterolemia    Low cholesterol diet and exercise.  Follow lipid panel.        Relevant Medications   amLODipine (NORVASC) 10 MG tablet   Other Relevant Orders   Hepatic function panel   Lipid panel   Hypertension    Blood pressure doing better on current regimen.  Follow pressures.  Follow metabolic panel.        Relevant Medications   amLODipine (NORVASC) 10 MG tablet   Other Relevant Orders   TSH   Basic metabolic panel   Leukopenia    Last white count wnl.  Recheck cbc with next labs.         Relevant Orders   CBC with Differential/Platelet   Stress    Doing well.  Follow.        Other Visit Diagnoses    Hyperglycemia    -  Primary   Relevant Orders   Hemoglobin A1c       Dale Tivoli, MD

## 2016-12-23 NOTE — Assessment & Plan Note (Signed)
Last white count wnl.  Recheck cbc with next labs.

## 2016-12-23 NOTE — Assessment & Plan Note (Signed)
Doing well.  Follow.  

## 2016-12-23 NOTE — Assessment & Plan Note (Signed)
Blood pressure doing better on current regimen.  Follow pressures.  Follow metabolic panel.  

## 2016-12-23 NOTE — Progress Notes (Signed)
Pre-visit discussion using our clinic review tool. No additional management support is needed unless otherwise documented below in the visit note.  

## 2016-12-23 NOTE — Assessment & Plan Note (Signed)
Low cholesterol diet and exercise.  Follow lipid panel.   

## 2016-12-23 NOTE — Assessment & Plan Note (Signed)
Follow liver panel.  Will recheck with next labs.  Has been stable.

## 2017-03-03 ENCOUNTER — Ambulatory Visit (INDEPENDENT_AMBULATORY_CARE_PROVIDER_SITE_OTHER): Payer: BC Managed Care – PPO | Admitting: Internal Medicine

## 2017-03-03 ENCOUNTER — Other Ambulatory Visit: Payer: BC Managed Care – PPO

## 2017-03-03 ENCOUNTER — Other Ambulatory Visit (INDEPENDENT_AMBULATORY_CARE_PROVIDER_SITE_OTHER): Payer: BC Managed Care – PPO

## 2017-03-03 ENCOUNTER — Encounter: Payer: Self-pay | Admitting: Internal Medicine

## 2017-03-03 VITALS — BP 132/80 | HR 72 | Temp 98.6°F | Ht 63.0 in | Wt 129.8 lb

## 2017-03-03 DIAGNOSIS — R634 Abnormal weight loss: Secondary | ICD-10-CM

## 2017-03-03 DIAGNOSIS — I10 Essential (primary) hypertension: Secondary | ICD-10-CM | POA: Diagnosis not present

## 2017-03-03 DIAGNOSIS — Z1231 Encounter for screening mammogram for malignant neoplasm of breast: Secondary | ICD-10-CM | POA: Diagnosis not present

## 2017-03-03 DIAGNOSIS — E7800 Pure hypercholesterolemia, unspecified: Secondary | ICD-10-CM

## 2017-03-03 DIAGNOSIS — Z0001 Encounter for general adult medical examination with abnormal findings: Secondary | ICD-10-CM | POA: Diagnosis not present

## 2017-03-03 DIAGNOSIS — R739 Hyperglycemia, unspecified: Secondary | ICD-10-CM

## 2017-03-03 DIAGNOSIS — F439 Reaction to severe stress, unspecified: Secondary | ICD-10-CM

## 2017-03-03 DIAGNOSIS — Z0000 Encounter for general adult medical examination without abnormal findings: Secondary | ICD-10-CM

## 2017-03-03 DIAGNOSIS — D72819 Decreased white blood cell count, unspecified: Secondary | ICD-10-CM | POA: Diagnosis not present

## 2017-03-03 DIAGNOSIS — Z1239 Encounter for other screening for malignant neoplasm of breast: Secondary | ICD-10-CM

## 2017-03-03 LAB — CBC WITH DIFFERENTIAL/PLATELET
Basophils Absolute: 0 10*3/uL (ref 0.0–0.1)
Basophils Relative: 0.7 % (ref 0.0–3.0)
EOS PCT: 3.8 % (ref 0.0–5.0)
Eosinophils Absolute: 0.2 10*3/uL (ref 0.0–0.7)
HEMATOCRIT: 46.5 % — AB (ref 36.0–46.0)
HEMOGLOBIN: 15.5 g/dL — AB (ref 12.0–15.0)
LYMPHS PCT: 34 % (ref 12.0–46.0)
Lymphs Abs: 1.7 10*3/uL (ref 0.7–4.0)
MCHC: 33.3 g/dL (ref 30.0–36.0)
MCV: 93.8 fl (ref 78.0–100.0)
MONO ABS: 0.4 10*3/uL (ref 0.1–1.0)
MONOS PCT: 7.7 % (ref 3.0–12.0)
Neutro Abs: 2.7 10*3/uL (ref 1.4–7.7)
Neutrophils Relative %: 53.8 % (ref 43.0–77.0)
Platelets: 257 10*3/uL (ref 150.0–400.0)
RBC: 4.95 Mil/uL (ref 3.87–5.11)
RDW: 13.2 % (ref 11.5–15.5)
WBC: 5.1 10*3/uL (ref 4.0–10.5)

## 2017-03-03 LAB — HEPATIC FUNCTION PANEL
ALBUMIN: 4.6 g/dL (ref 3.5–5.2)
ALK PHOS: 80 U/L (ref 39–117)
ALT: 16 U/L (ref 0–35)
AST: 17 U/L (ref 0–37)
Bilirubin, Direct: 0.2 mg/dL (ref 0.0–0.3)
Total Bilirubin: 1.3 mg/dL — ABNORMAL HIGH (ref 0.2–1.2)
Total Protein: 7.1 g/dL (ref 6.0–8.3)

## 2017-03-03 LAB — LIPID PANEL
Cholesterol: 187 mg/dL (ref 0–200)
HDL: 58.3 mg/dL (ref >39.00)
LDL Cholesterol: 99 mg/dL (ref 0–99)
NONHDL: 128.49
Total CHOL/HDL Ratio: 3
Triglycerides: 145 mg/dL (ref 0.0–149.0)
VLDL: 29 mg/dL (ref 0.0–40.0)

## 2017-03-03 LAB — BASIC METABOLIC PANEL
BUN: 14 mg/dL (ref 6–23)
CHLORIDE: 102 meq/L (ref 96–112)
CO2: 32 mEq/L (ref 19–32)
Calcium: 9.7 mg/dL (ref 8.4–10.5)
Creatinine, Ser: 0.71 mg/dL (ref 0.40–1.20)
GFR: 89.6 mL/min (ref >60.00)
Glucose, Bld: 95 mg/dL (ref 70–99)
POTASSIUM: 3.6 meq/L (ref 3.5–5.1)
Sodium: 141 mEq/L (ref 135–145)

## 2017-03-03 LAB — HEMOGLOBIN A1C: Hgb A1c MFr Bld: 5.4 % (ref 4.6–6.5)

## 2017-03-03 LAB — TSH: TSH: 1.19 u[IU]/mL (ref 0.35–4.50)

## 2017-03-03 NOTE — Assessment & Plan Note (Addendum)
Physical today 03/03/17.  Mammogram 05/03/16 - Birads I.  Colonoscopy 06/2014.  PAP 02/26/15 - negative with negative HPV.  Schedule f/u mammogram.

## 2017-03-03 NOTE — Progress Notes (Signed)
Patient ID: Melinda Barber, female   DOB: 01/04/1956, 61 y.o.   MRN: [id number]   Subjective:    Patient ID: Melinda Barber, female    DOB: 01/20/1956, 61 y.o.   MRN: [id number]  HPI  Patient here for her physical exam.  She has been under increased stress recently with family issues.  Discussed with her today.  She does not feel needs anything more at this point.  Will notify me if needs anything.  Stays active.  No chest pain.  No sob.  No acid reflux.  No abdominal pain.  Bowels moving.  Decreased weight.  States for a while was not eating well.  Was having to force herself to eat.  Eating better now.     Past Medical History:  Diagnosis Date  . Hypercholesterolemia   . Hypertension    History reviewed. No pertinent surgical history. Family History  Problem Relation Age of Onset  . Breast cancer Other 47  . Hypertension Mother   . Hypercholesterolemia Mother   . Diabetes Mother   . Transient ischemic attack Mother   . Aneurysm Father   . Hypertension Brother   . Aneurysm Brother        heart  . Colon cancer Neg Hx    Social History   Social History  . Marital status: Married    Spouse name: N/A  . Number of children: 3  . Years of education: N/A   Social History Main Topics  . Smoking status: Former Smoker    Quit date: 09/20/1991  . Smokeless tobacco: Never Used  . Alcohol use 0.0 oz/week  . Drug use: No  . Sexual activity: Not Asked   Other Topics Concern  . None   Social History Narrative  . None    Outpatient Encounter Prescriptions as of 03/03/2017  Medication Sig  . amLODipine (NORVASC) 10 MG tablet Take 1 tablet (10 mg total) by mouth daily.  . clotrimazole-betamethasone (LOTRISONE) cream Apply 1 application topically 2 (two) times daily.  Marland Kitchen. losartan-hydrochlorothiazide (HYZAAR) 100-25 MG tablet TAKE 1 TABLET BY MOUTH DAILY.  . metoprolol succinate (TOPROL-XL) 25 MG 24 hr tablet TAKE 1 TABLET (25 MG TOTAL) BY MOUTH DAILY.  . Multiple Vitamin (MULTIVITAMIN)  capsule Take 1 capsule by mouth daily.   No facility-administered encounter medications on file as of 03/03/2017.     Review of Systems  Constitutional: Positive for appetite change.       Appetite improved now.  Some weight loss.    HENT: Negative for congestion and sinus pressure.   Eyes: Negative for pain and visual disturbance.  Respiratory: Negative for cough, chest tightness and shortness of breath.   Cardiovascular: Negative for chest pain, palpitations and leg swelling.  Gastrointestinal: Negative for abdominal pain, diarrhea, nausea and vomiting.  Genitourinary: Negative for difficulty urinating and dysuria.  Musculoskeletal: Negative for back pain and joint swelling.  Skin: Negative for color change and rash.  Neurological: Negative for dizziness, light-headedness and headaches.  Hematological: Negative for adenopathy. Does not bruise/bleed easily.  Psychiatric/Behavioral: Negative for agitation and dysphoric mood.       Objective:    Physical Exam  Constitutional: She is oriented to person, place, and time. She appears well-developed and well-nourished. No distress.  HENT:  Nose: Nose normal.  Mouth/Throat: Oropharynx is clear and moist.  Eyes: Right eye exhibits no discharge. Left eye exhibits no discharge. No scleral icterus.  Neck: Neck supple. No thyromegaly present.  Cardiovascular: Normal rate and regular rhythm.  Pulmonary/Chest: Breath sounds normal. No accessory muscle usage. No tachypnea. No respiratory distress. She has no decreased breath sounds. She has no wheezes. She has no rhonchi. Right breast exhibits no inverted nipple, no mass, no nipple discharge and no tenderness (no axillary adenopathy). Left breast exhibits no inverted nipple, no mass, no nipple discharge and no tenderness (no axilarry adenopathy).  Abdominal: Soft. Bowel sounds are normal. There is no tenderness.  Musculoskeletal: She exhibits no edema or tenderness.  Lymphadenopathy:    She has  no cervical adenopathy.  Neurological: She is alert and oriented to person, place, and time.  Skin: Skin is warm. No rash noted. No erythema.  Psychiatric: She has a normal mood and affect. Her behavior is normal.    BP 132/80   Pulse 72   Temp 98.6 F (37 C) (Oral)   Ht 5\' 3"  (1.6 m)   Wt 129 lb 12.8 oz (58.9 kg)   LMP 06/28/2006   SpO2 97%   BMI 22.99 kg/m  Wt Readings from Last 3 Encounters:  03/03/17 129 lb 12.8 oz (58.9 kg)  12/23/16 135 lb 3.2 oz (61.3 kg)  10/20/16 140 lb (63.5 kg)     Lab Results  Component Value Date   WBC 5.1 03/03/2017   HGB 15.5 (H) 03/03/2017   HCT 46.5 (H) 03/03/2017   PLT 257.0 03/03/2017   GLUCOSE 95 03/03/2017   CHOL 187 03/03/2017   TRIG 145.0 03/03/2017   HDL 58.30 03/03/2017   LDLDIRECT 143.8 03/18/2013   LDLCALC 99 03/03/2017   ALT 16 03/03/2017   AST 17 03/03/2017   NA 141 03/03/2017   K 3.6 03/03/2017   CL 102 03/03/2017   CREATININE 0.71 03/03/2017   BUN 14 03/03/2017   CO2 32 03/03/2017   TSH 1.19 03/03/2017   HGBA1C 5.4 03/03/2017    Mm Screening Breast Tomo Bilateral  Result Date: 05/03/2016 CLINICAL DATA:  Screening. EXAM: 2D DIGITAL SCREENING BILATERAL MAMMOGRAM WITH CAD AND ADJUNCT TOMO COMPARISON:  Previous exam(s). ACR Breast Density Category b: There are scattered areas of fibroglandular density. FINDINGS: There are no findings suspicious for malignancy. Images were processed with CAD. IMPRESSION: No mammographic evidence of malignancy. A result letter of this screening mammogram will be mailed directly to the patient. RECOMMENDATION: Screening mammogram in one year. (Code:SM-B-01Y) BI-RADS CATEGORY  1: Negative. Electronically Signed   By: Dalphine Handing M.D.   On: 05/03/2016 18:05       Assessment & Plan:   Problem List Items Addressed This Visit    Health care maintenance    Physical today 03/03/17.  Mammogram 05/03/16 - Birads I.  Colonoscopy 06/2014.  PAP 02/26/15 - negative with negative HPV.  Schedule f/u  mammogram.        Hyperbilirubinemia    Has been stable.  Follow liver panel.       Hypercholesterolemia    Low cholesterol diet and exercise.  Follow lipid panel.        Relevant Orders   Lipid panel   Hepatic function panel   Hypertension    Blood pressure under good control.  Continue same medication regimen.  Follow pressures.  Follow metabolic panel.        Relevant Orders   Basic metabolic panel   Stress    Increased stress as outlined.  Discussed with her today.  She does not feel needs anything at this point.  Follow.         Other Visit Diagnoses    Breast  cancer screening    -  Primary   Relevant Orders   MM DIGITAL SCREENING BILATERAL   Weight loss       Eating better now.  Follow.     Hyperglycemia       Relevant Orders   Hemoglobin A1c       Dale Geronimo, MD

## 2017-03-03 NOTE — Progress Notes (Signed)
Pre visit review using our clinic review tool, if applicable. No additional management support is needed unless otherwise documented below in the visit note. 

## 2017-03-04 ENCOUNTER — Encounter: Payer: Self-pay | Admitting: Internal Medicine

## 2017-03-04 NOTE — Assessment & Plan Note (Signed)
Low cholesterol diet and exercise.  Follow lipid panel.   

## 2017-03-04 NOTE — Assessment & Plan Note (Signed)
Increased stress as outlined.  Discussed with her today.  She does not feel needs anything at this point.  Follow.

## 2017-03-04 NOTE — Assessment & Plan Note (Signed)
Has been stable.  Follow liver panel.

## 2017-03-04 NOTE — Assessment & Plan Note (Signed)
Blood pressure under good control.  Continue same medication regimen.  Follow pressures.  Follow metabolic panel.   

## 2017-03-05 ENCOUNTER — Encounter: Payer: Self-pay | Admitting: Internal Medicine

## 2017-03-23 ENCOUNTER — Other Ambulatory Visit: Payer: Self-pay | Admitting: Internal Medicine

## 2017-05-08 ENCOUNTER — Ambulatory Visit
Admission: RE | Admit: 2017-05-08 | Discharge: 2017-05-08 | Disposition: A | Discharge status: Home / Self Care | Payer: BC Managed Care – PPO | Source: Ambulatory Visit | Attending: Internal Medicine | Admitting: Internal Medicine

## 2017-05-08 DIAGNOSIS — Z1239 Encounter for other screening for malignant neoplasm of breast: Secondary | ICD-10-CM

## 2017-05-08 DIAGNOSIS — Z1231 Encounter for screening mammogram for malignant neoplasm of breast: Secondary | ICD-10-CM | POA: Diagnosis not present

## 2017-06-19 ENCOUNTER — Ambulatory Visit (INDEPENDENT_AMBULATORY_CARE_PROVIDER_SITE_OTHER): Payer: BC Managed Care – PPO | Admitting: Internal Medicine

## 2017-06-19 ENCOUNTER — Encounter: Payer: Self-pay | Admitting: Internal Medicine

## 2017-06-19 VITALS — BP 126/78 | HR 63 | Temp 98.5°F | Resp 14 | Ht 63.0 in | Wt 130.0 lb

## 2017-06-19 DIAGNOSIS — F439 Reaction to severe stress, unspecified: Secondary | ICD-10-CM

## 2017-06-19 DIAGNOSIS — D582 Other hemoglobinopathies: Secondary | ICD-10-CM

## 2017-06-19 DIAGNOSIS — I10 Essential (primary) hypertension: Secondary | ICD-10-CM | POA: Diagnosis not present

## 2017-06-19 DIAGNOSIS — E7800 Pure hypercholesterolemia, unspecified: Secondary | ICD-10-CM | POA: Diagnosis not present

## 2017-06-19 NOTE — Progress Notes (Signed)
Patient ID: Melinda Barber, female   DOB: [birth year], 61 y.o.   MRN: [id number]   Subjective:    Patient ID: Melinda Barber, female    DOB: [birth year], 61 y.o.   MRN: [id number]  HPI  Patient here for a scheduled follow up.  Still with increased stress, but overall she feels she is handling things relatively well.  Discussed with her today.  Stays active.  No chest pain.  No sob.  No acid reflux.  No abdominal pain or cramping.  Bowels stable.  Blood pressure doing well.     Past Medical History:  Diagnosis Date  . Hypercholesterolemia   . Hypertension    History reviewed. No pertinent surgical history. Family History  Problem Relation Age of Onset  . Breast cancer Other 47  . Hypertension Mother   . Hypercholesterolemia Mother   . Diabetes Mother   . Transient ischemic attack Mother   . Aneurysm Father   . Hypertension Brother   . Aneurysm Brother        heart  . Colon cancer Neg Hx    Social History   Social History  . Marital status: Married    Spouse name: N/A  . Number of children: 3  . Years of education: N/A   Social History Main Topics  . Smoking status: Former Smoker    Quit date: 09/20/1991  . Smokeless tobacco: Never Used  . Alcohol use 0.0 oz/week  . Drug use: No  . Sexual activity: Not Asked   Other Topics Concern  . None   Social History Narrative  . None    Outpatient Encounter Prescriptions as of 06/19/2017  Medication Sig  . amLODipine (NORVASC) 10 MG tablet Take 1 tablet (10 mg total) by mouth daily.  . clotrimazole-betamethasone (LOTRISONE) cream Apply 1 application topically 2 (two) times daily.  Marland Kitchen losartan-hydrochlorothiazide (HYZAAR) 100-25 MG tablet TAKE 1 TABLET BY MOUTH DAILY.  . metoprolol succinate (TOPROL-XL) 25 MG 24 hr tablet TAKE 1 TABLET (25 MG TOTAL) BY MOUTH DAILY.  . Multiple Vitamin (MULTIVITAMIN) capsule Take 1 capsule by mouth daily.   No facility-administered encounter medications on file as of 06/19/2017.     Review of  Systems  Constitutional: Negative for appetite change and unexpected weight change.  HENT: Negative for congestion and sinus pressure.   Respiratory: Negative for cough, chest tightness and shortness of breath.   Cardiovascular: Negative for chest pain, palpitations and leg swelling.  Gastrointestinal: Negative for abdominal pain, diarrhea, nausea and vomiting.  Genitourinary: Negative for difficulty urinating and dysuria.  Musculoskeletal: Negative for back pain and joint swelling.  Neurological: Negative for dizziness, light-headedness and headaches.  Psychiatric/Behavioral: Negative for agitation and dysphoric mood.       Increased stress.  Handling stress.  Does not feel needs any further intervention.         Objective:     Blood pressure rechecked by me:  126/78  Physical Exam  Constitutional: She appears well-developed and well-nourished. No distress.  HENT:  Nose: Nose normal.  Mouth/Throat: Oropharynx is clear and moist.  Neck: Neck supple. No thyromegaly present.  Cardiovascular: Normal rate and regular rhythm.   Pulmonary/Chest: Breath sounds normal. No respiratory distress. She has no wheezes.  Abdominal: Soft. Bowel sounds are normal. There is no tenderness.  Musculoskeletal: She exhibits no edema or tenderness.  Lymphadenopathy:    She has no cervical adenopathy.  Skin: No rash noted. No erythema.  Psychiatric: She has a normal mood and affect. Her behavior  is normal.    BP 126/78 (BP Location: Left Arm, Patient Position: Sitting, Cuff Size: Normal)   Pulse 63   Temp 98.5 F (36.9 C) (Oral)   Resp 14   Ht  (1.6 m)   Wt 130 lb (59 kg)   LMP 06/28/2006   SpO2 96%   BMI 23.03 kg/m  Wt Readings from Last 3 Encounters:  06/19/17 130 lb (59 kg)  03/03/17 129 lb 12.8 oz (58.9 kg)  12/23/16 135 lb 3.2 oz (61.3 kg)     Lab Results  Component Value Date   WBC 5.1 03/03/2017   HGB 15.5 (H) 03/03/2017   HCT 46.5 (H) 03/03/2017   PLT 257.0 03/03/2017    GLUCOSE 95 03/03/2017   CHOL 187 03/03/2017   TRIG 145.0 03/03/2017   HDL 58.30 03/03/2017   LDLDIRECT 143.8 03/18/2013   LDLCALC 99 03/03/2017   ALT 16 03/03/2017   AST 17 03/03/2017   NA 141 03/03/2017   K 3.6 03/03/2017   CL 102 03/03/2017   CREATININE 0.71 03/03/2017   BUN 14 03/03/2017   CO2 32 03/03/2017   TSH 1.19 03/03/2017   HGBA1C 5.4 03/03/2017    Mm Screening Breast Tomo Bilateral  Result Date: 05/09/2017 CLINICAL DATA:  Screening. EXAM: 2D DIGITAL SCREENING BILATERAL MAMMOGRAM WITH CAD AND ADJUNCT TOMO COMPARISON:  Previous exam(s). ACR Breast Density Category b: There are scattered areas of fibroglandular density. FINDINGS: There are no findings suspicious for malignancy. Images were processed with CAD. IMPRESSION: No mammographic evidence of malignancy. A result letter of this screening mammogram will be mailed directly to the patient. RECOMMENDATION: Screening mammogram in one year. (Code:SM-B-01Y) BI-RADS CATEGORY  1: Negative. Electronically Signed   By: Frederico Hamman M.D.   On: 05/09/2017 12:28       Assessment & Plan:   Problem List Items Addressed This Visit    Hypercholesterolemia    Low cholesterol diet and exercise.  Follow lipid panel.        Hypertension    Blood pressure under good control.  Continue same medication regimen.  Follow pressures.  Follow metabolic panel.        Stress    Increased stress as outlined.  Discussed with her today.  She does not feel needs any further at this time.  Follow.         Other Visit Diagnoses    Elevated hemoglobin (HCC)    -  Primary   Relevant Orders   CBC with Differential/Platelet       Dale [place], MD

## 2017-06-20 ENCOUNTER — Encounter: Payer: Self-pay | Admitting: Internal Medicine

## 2017-06-20 NOTE — Assessment & Plan Note (Signed)
Increased stress as outlined.  Discussed with her today.  She does not feel needs any further at this time.  Follow.

## 2017-06-20 NOTE — Assessment & Plan Note (Signed)
Low cholesterol diet and exercise.  Follow lipid panel.   

## 2017-06-20 NOTE — Assessment & Plan Note (Signed)
Blood pressure under good control.  Continue same medication regimen.  Follow pressures.  Follow metabolic panel.   

## 2017-12-04 ENCOUNTER — Other Ambulatory Visit (INDEPENDENT_AMBULATORY_CARE_PROVIDER_SITE_OTHER): Payer: BC Managed Care – PPO

## 2017-12-04 DIAGNOSIS — E7800 Pure hypercholesterolemia, unspecified: Secondary | ICD-10-CM

## 2017-12-04 DIAGNOSIS — D582 Other hemoglobinopathies: Secondary | ICD-10-CM

## 2017-12-04 DIAGNOSIS — R739 Hyperglycemia, unspecified: Secondary | ICD-10-CM

## 2017-12-04 DIAGNOSIS — I10 Essential (primary) hypertension: Secondary | ICD-10-CM

## 2017-12-04 LAB — LIPID PANEL
CHOLESTEROL: 176 mg/dL (ref 0–200)
HDL: 67.6 mg/dL (ref >39.00)
LDL Cholesterol: 92 mg/dL (ref 0–99)
NONHDL: 108.39
Total CHOL/HDL Ratio: 3
Triglycerides: 81 mg/dL (ref 0.0–149.0)
VLDL: 16.2 mg/dL (ref 0.0–40.0)

## 2017-12-04 LAB — HEPATIC FUNCTION PANEL
ALBUMIN: 4.5 g/dL (ref 3.5–5.2)
ALK PHOS: 68 U/L (ref 39–117)
ALT: 17 U/L (ref 0–35)
AST: 19 U/L (ref 0–37)
Bilirubin, Direct: 0.2 mg/dL (ref 0.0–0.3)
TOTAL PROTEIN: 7.1 g/dL (ref 6.0–8.3)
Total Bilirubin: 1.6 mg/dL — ABNORMAL HIGH (ref 0.2–1.2)

## 2017-12-04 LAB — CBC WITH DIFFERENTIAL/PLATELET
Basophils Absolute: 0 10*3/uL (ref 0.0–0.1)
Basophils Relative: 0.9 % (ref 0.0–3.0)
EOS PCT: 1.9 % (ref 0.0–5.0)
Eosinophils Absolute: 0.1 10*3/uL (ref 0.0–0.7)
HEMATOCRIT: 40.5 % (ref 36.0–46.0)
HEMOGLOBIN: 13.8 g/dL (ref 12.0–15.0)
LYMPHS PCT: 30.3 % (ref 12.0–46.0)
Lymphs Abs: 1.6 10*3/uL (ref 0.7–4.0)
MCHC: 34.2 g/dL (ref 30.0–36.0)
MCV: 92.8 fl (ref 78.0–100.0)
MONO ABS: 0.4 10*3/uL (ref 0.1–1.0)
MONOS PCT: 8 % (ref 3.0–12.0)
Neutro Abs: 3.1 10*3/uL (ref 1.4–7.7)
Neutrophils Relative %: 58.9 % (ref 43.0–77.0)
Platelets: 196 10*3/uL (ref 150.0–400.0)
RBC: 4.37 Mil/uL (ref 3.87–5.11)
RDW: 12.8 % (ref 11.5–15.5)
WBC: 5.3 10*3/uL (ref 4.0–10.5)

## 2017-12-04 LAB — BASIC METABOLIC PANEL
BUN: 17 mg/dL (ref 6–23)
CHLORIDE: 102 meq/L (ref 96–112)
CO2: 31 mEq/L (ref 19–32)
CREATININE: 0.7 mg/dL (ref 0.40–1.20)
Calcium: 9.9 mg/dL (ref 8.4–10.5)
GFR: 90.84 mL/min (ref >60.00)
GLUCOSE: 98 mg/dL (ref 70–99)
POTASSIUM: 4.2 meq/L (ref 3.5–5.1)
Sodium: 140 mEq/L (ref 135–145)

## 2017-12-04 LAB — HEMOGLOBIN A1C: Hgb A1c MFr Bld: 5.3 % (ref 4.6–6.5)

## 2017-12-05 ENCOUNTER — Encounter: Payer: Self-pay | Admitting: Internal Medicine

## 2017-12-06 NOTE — Telephone Encounter (Signed)
Please call Melinda Barber and explain that if Mother having issues, needs to be seen.  I can see her 12:00 on 12/08/17.  Will need to put info in mother's chart.  See me if any questions.

## 2017-12-07 ENCOUNTER — Telehealth: Payer: Self-pay

## 2017-12-07 ENCOUNTER — Encounter: Payer: Self-pay | Admitting: Internal Medicine

## 2017-12-07 NOTE — Telephone Encounter (Signed)
See mother's chart.

## 2017-12-07 NOTE — Telephone Encounter (Signed)
See moms chart.

## 2017-12-25 ENCOUNTER — Other Ambulatory Visit: Payer: Self-pay | Admitting: Internal Medicine

## 2018-03-12 ENCOUNTER — Other Ambulatory Visit (HOSPITAL_COMMUNITY)
Admission: RE | Admit: 2018-03-12 | Discharge: 2018-03-12 | Disposition: A | Discharge status: Home / Self Care | Payer: BC Managed Care – PPO | Source: Ambulatory Visit | Attending: Internal Medicine | Admitting: Internal Medicine

## 2018-03-12 ENCOUNTER — Encounter: Payer: Self-pay | Admitting: Internal Medicine

## 2018-03-12 ENCOUNTER — Ambulatory Visit (INDEPENDENT_AMBULATORY_CARE_PROVIDER_SITE_OTHER): Payer: BC Managed Care – PPO | Admitting: Internal Medicine

## 2018-03-12 VITALS — BP 130/76 | HR 57 | Temp 98.2°F | Resp 16 | Ht 63.0 in | Wt 133.0 lb

## 2018-03-12 DIAGNOSIS — Z1239 Encounter for other screening for malignant neoplasm of breast: Secondary | ICD-10-CM

## 2018-03-12 DIAGNOSIS — E2839 Other primary ovarian failure: Secondary | ICD-10-CM

## 2018-03-12 DIAGNOSIS — R002 Palpitations: Secondary | ICD-10-CM

## 2018-03-12 DIAGNOSIS — E7800 Pure hypercholesterolemia, unspecified: Secondary | ICD-10-CM

## 2018-03-12 DIAGNOSIS — Z1231 Encounter for screening mammogram for malignant neoplasm of breast: Secondary | ICD-10-CM | POA: Diagnosis not present

## 2018-03-12 DIAGNOSIS — D582 Other hemoglobinopathies: Secondary | ICD-10-CM | POA: Diagnosis not present

## 2018-03-12 DIAGNOSIS — I10 Essential (primary) hypertension: Secondary | ICD-10-CM

## 2018-03-12 DIAGNOSIS — Z0000 Encounter for general adult medical examination without abnormal findings: Secondary | ICD-10-CM

## 2018-03-12 DIAGNOSIS — Z124 Encounter for screening for malignant neoplasm of cervix: Secondary | ICD-10-CM | POA: Insufficient documentation

## 2018-03-12 DIAGNOSIS — F439 Reaction to severe stress, unspecified: Secondary | ICD-10-CM

## 2018-03-12 MED ORDER — METOPROLOL SUCCINATE ER 25 MG PO TB24: ORAL_TABLET | ORAL | 3 refills | Status: DC

## 2018-03-12 MED ORDER — LOSARTAN POTASSIUM-HCTZ 100-25 MG PO TABS: 1.0000 | ORAL_TABLET | Freq: Every day | ORAL | 3 refills | Status: DC

## 2018-03-12 NOTE — Progress Notes (Signed)
Patient ID: Melinda Barber, female   DOB: 1956/07/01, 62 y.o.   MRN: [id number]   Subjective:    Patient ID: Melinda Barber, female    DOB: 22-Dec-1955, 62 y.o.   MRN: [id number]  HPI  Patient here for her physical exam.  She reports she has been doing relatively well.  Increased stress.  Overall she feels she is handling things relatively well.  Has noticed some occasional heart palpitations with increased stress.  States as soon as she goes into another room and relaxes, resolves.  Stays active.  No chest pain.  No sob.  No acid reflux.  No abdominal pain.  Bowels moving.  Her son and his three children are now living with her.  Overall she feels she is handling things relatively well.     Past Medical History:  Diagnosis Date  . Hypercholesterolemia   . Hypertension    History reviewed. No pertinent surgical history. Family History  Problem Relation Age of Onset  . Breast cancer Other 47  . Hypertension Mother   . Hypercholesterolemia Mother   . Diabetes Mother   . Transient ischemic attack Mother   . Aneurysm Father   . Hypertension Brother   . Aneurysm Brother        heart  . Colon cancer Neg Hx    Social History   Socioeconomic History  . Marital status: Married    Spouse name: Not on file  . Number of children: 3  . Years of education: Not on file  . Highest education level: Not on file  Occupational History  . Not on file  Social Needs  . Financial resource strain: Not on file  . Food insecurity:    Worry: Not on file    Inability: Not on file  . Transportation needs:    Medical: Not on file    Non-medical: Not on file  Tobacco Use  . Smoking status: Former Smoker    Last attempt to quit: 09/20/1991    Years since quitting: 26.5  . Smokeless tobacco: Never Used  Substance and Sexual Activity  . Alcohol use: Yes    Alcohol/week: 0.0 oz  . Drug use: No  . Sexual activity: Not on file  Lifestyle  . Physical activity:    Days per week: Not on file    Minutes  per session: Not on file  . Stress: Not on file  Relationships  . Social connections:    Talks on phone: Not on file    Gets together: Not on file    Attends religious service: Not on file    Active member of club or organization: Not on file    Attends meetings of clubs or organizations: Not on file    Relationship status: Not on file  Other Topics Concern  . Not on file  Social History Narrative  . Not on file    Outpatient Encounter Medications as of 03/12/2018  Medication Sig  . amLODipine (NORVASC) 10 MG tablet TAKE 1 TABLET (10 MG TOTAL) BY MOUTH DAILY.  . clotrimazole-betamethasone (LOTRISONE) cream Apply 1 application topically 2 (two) times daily.  Marland Kitchen losartan-hydrochlorothiazide (HYZAAR) 100-25 MG tablet Take 1 tablet by mouth daily.  . metoprolol succinate (TOPROL-XL) 25 MG 24 hr tablet TAKE 1 TABLET (25 MG TOTAL) BY MOUTH DAILY.  . Multiple Vitamin (MULTIVITAMIN) capsule Take 1 capsule by mouth daily.  . [DISCONTINUED] losartan-hydrochlorothiazide (HYZAAR) 100-25 MG tablet TAKE 1 TABLET BY MOUTH DAILY.  . [DISCONTINUED] metoprolol succinate (TOPROL-XL) 25  MG 24 hr tablet TAKE 1 TABLET (25 MG TOTAL) BY MOUTH DAILY.   No facility-administered encounter medications on file as of 03/12/2018.     Review of Systems  Constitutional: Negative for appetite change and unexpected weight change.  HENT: Negative for congestion and sinus pressure.   Respiratory: Negative for cough, chest tightness and shortness of breath.   Cardiovascular: Negative for chest pain and leg swelling.       Occasional palpitations as outlined.    Gastrointestinal: Negative for abdominal pain, diarrhea, nausea and vomiting.  Genitourinary: Negative for difficulty urinating and dysuria.  Musculoskeletal: Negative for joint swelling and myalgias.  Skin: Negative for color change and rash.  Neurological: Negative for dizziness, light-headedness and headaches.  Psychiatric/Behavioral: Negative for agitation  and dysphoric mood.       Increased stress as outlined.         Objective:     Blood pressure rechecked by me:  120/82  Physical Exam  Constitutional: She is oriented to person, place, and time. She appears well-developed and well-nourished. No distress.  HENT:  Nose: Nose normal.  Mouth/Throat: Oropharynx is clear and moist.  Eyes: Right eye exhibits no discharge. Left eye exhibits no discharge. No scleral icterus.  Neck: Neck supple. No thyromegaly present.  Cardiovascular: Normal rate and regular rhythm.  Pulmonary/Chest: Breath sounds normal. No accessory muscle usage. No tachypnea. No respiratory distress. She has no decreased breath sounds. She has no wheezes. She has no rhonchi. Right breast exhibits no inverted nipple, no mass, no nipple discharge and no tenderness (no axillary adenopathy). Left breast exhibits no inverted nipple, no mass, no nipple discharge and no tenderness (no axilarry adenopathy).  Abdominal: Soft. Bowel sounds are normal. There is no tenderness.  Musculoskeletal: She exhibits no edema or tenderness.  Lymphadenopathy:    She has no cervical adenopathy.  Neurological: She is alert and oriented to person, place, and time.  Skin: No rash noted. No erythema.  Psychiatric: She has a normal mood and affect. Her behavior is normal.    BP 130/76 (BP Location: Left Arm, Patient Position: Sitting, Cuff Size: Normal)   Pulse (!) 57   Temp 98.2 F (36.8 C) (Oral)   Resp 16   Ht 5\' 3"  (1.6 m)   Wt 133 lb (60.3 kg)   LMP 06/28/2006   SpO2 98%   BMI 23.56 kg/m  Wt Readings from Last 3 Encounters:  03/12/18 133 lb (60.3 kg)  06/19/17 130 lb (59 kg)  03/03/17 129 lb 12.8 oz (58.9 kg)     Lab Results  Component Value Date   WBC 5.3 12/04/2017   HGB 13.8 12/04/2017   HCT 40.5 12/04/2017   PLT 196.0 12/04/2017   GLUCOSE 98 12/04/2017   CHOL 176 12/04/2017   TRIG 81.0 12/04/2017   HDL 67.60 12/04/2017   LDLDIRECT 143.8 03/18/2013   LDLCALC 92  12/04/2017   ALT 17 12/04/2017   AST 19 12/04/2017   NA 140 12/04/2017   K 4.2 12/04/2017   CL 102 12/04/2017   CREATININE 0.70 12/04/2017   BUN 17 12/04/2017   CO2 31 12/04/2017   TSH 1.19 03/03/2017   HGBA1C 5.3 12/04/2017    Mm Screening Breast Tomo Bilateral  Result Date: 05/09/2017 CLINICAL DATA:  Screening. EXAM: 2D DIGITAL SCREENING BILATERAL MAMMOGRAM WITH CAD AND ADJUNCT TOMO COMPARISON:  Previous exam(s). ACR Breast Density Category b: There are scattered areas of fibroglandular density. FINDINGS: There are no findings suspicious for malignancy. Images were processed  with CAD. IMPRESSION: No mammographic evidence of malignancy. A result letter of this screening mammogram will be mailed directly to the patient. RECOMMENDATION: Screening mammogram in one year. (Code:SM-B-01Y) BI-RADS CATEGORY  1: Negative. Electronically Signed   By: Frederico Hamman M.D.   On: 05/09/2017 12:28       Assessment & Plan:   Problem List Items Addressed This Visit    Elevated hemoglobin (HCC)    Last hgb 11/2017 wnl.  Follow.        Health care maintenance    Physical today 03/12/18.  Mammogram 05/09/17 - [facility] I.  She will schedule f/u mammogram.  Order placed.  PAP 03/12/18.  Colonoscopy 06/2014.  One 11mm polyp otherwise normal.  She will schedule bone density as well.  Order placed.        Hyperbilirubinemia    Stable.  Follow liver panel.       Relevant Orders   Hepatic function panel   Hypercholesterolemia    Low cholesterol diet and exercise.  Follow lipid panel.        Relevant Medications   metoprolol succinate (TOPROL-XL) 25 MG 24 hr tablet   losartan-hydrochlorothiazide (HYZAAR) 100-25 MG tablet   Other Relevant Orders   Lipid panel   Hypertension    Blood pressure under good control.  Continue same medication regimen.  Follow pressures.  Follow metabolic panel.        Relevant Medications   metoprolol succinate (TOPROL-XL) 25 MG 24 hr tablet    losartan-hydrochlorothiazide (HYZAAR) 100-25 MG tablet   Other Relevant Orders   TSH   Basic metabolic panel   Palpitations    Palpitations as outlined.  Stays active.  No chest pain, sob or palpitations with increased activity or exertion.  Discussed with her regarding further w/up, including EKG,etc.  She declines.  Wants to monitor.  Follow.        Stress    Increased stress as outlined.  Discussed with her today.  She does not feel needs any further intervention.  Follow.         Other Visit Diagnoses    Routine general medical examination at a health care facility    -  Primary   Screening for cervical cancer       Relevant Orders   Cytology - PAP (Completed)   Breast cancer screening       Relevant Orders   MM 3D SCREEN BREAST BILATERAL   Estrogen deficiency       Relevant Orders   DG Bone Density       Dale [place], MD

## 2018-03-12 NOTE — Assessment & Plan Note (Addendum)
Physical today 03/12/18.  Mammogram 05/09/17 - Birads I.  She will schedule f/u mammogram.  Order placed.  PAP 03/12/18.  Colonoscopy 06/2014.  One 11mm polyp otherwise normal.  She will schedule bone density as well.  Order placed.

## 2018-03-13 LAB — CYTOLOGY - PAP
DIAGNOSIS: NEGATIVE
HPV: NOT DETECTED

## 2018-03-14 ENCOUNTER — Encounter: Payer: Self-pay | Admitting: Internal Medicine

## 2018-03-17 ENCOUNTER — Encounter: Payer: Self-pay | Admitting: Internal Medicine

## 2018-03-17 DIAGNOSIS — D582 Other hemoglobinopathies: Secondary | ICD-10-CM | POA: Insufficient documentation

## 2018-03-17 DIAGNOSIS — R002 Palpitations: Secondary | ICD-10-CM | POA: Insufficient documentation

## 2018-03-17 NOTE — Assessment & Plan Note (Signed)
Palpitations as outlined.  Stays active.  No chest pain, sob or palpitations with increased activity or exertion.  Discussed with her regarding further w/up, including EKG,etc.  She declines.  Wants to monitor.  Follow.

## 2018-03-17 NOTE — Assessment & Plan Note (Signed)
Blood pressure under good control.  Continue same medication regimen.  Follow pressures.  Follow metabolic panel.   

## 2018-03-17 NOTE — Assessment & Plan Note (Signed)
Stable.  Follow liver panel.  

## 2018-03-17 NOTE — Assessment & Plan Note (Signed)
Last hgb 11/2017 wnl.  Follow.

## 2018-03-17 NOTE — Assessment & Plan Note (Signed)
Increased stress as outlined.  Discussed with her today.  She does not feel needs any further intervention.  Follow.   

## 2018-03-17 NOTE — Assessment & Plan Note (Signed)
Low cholesterol diet and exercise.  Follow lipid panel.   

## 2018-04-09 ENCOUNTER — Telehealth: Payer: Self-pay | Admitting: Internal Medicine

## 2018-04-09 NOTE — Telephone Encounter (Signed)
My chart message sent to pt informing overdue for colonoscopy.  Need to schedule.

## 2018-05-09 ENCOUNTER — Ambulatory Visit
Admission: RE | Admit: 2018-05-09 | Discharge: 2018-05-09 | Disposition: A | Discharge status: Home / Self Care | Payer: BC Managed Care – PPO | Source: Ambulatory Visit | Attending: Internal Medicine | Admitting: Internal Medicine

## 2018-05-09 DIAGNOSIS — Z1231 Encounter for screening mammogram for malignant neoplasm of breast: Secondary | ICD-10-CM | POA: Diagnosis present

## 2018-05-09 DIAGNOSIS — E2839 Other primary ovarian failure: Secondary | ICD-10-CM | POA: Insufficient documentation

## 2018-05-09 DIAGNOSIS — Z1239 Encounter for other screening for malignant neoplasm of breast: Secondary | ICD-10-CM

## 2018-06-14 ENCOUNTER — Other Ambulatory Visit (INDEPENDENT_AMBULATORY_CARE_PROVIDER_SITE_OTHER): Payer: BC Managed Care – PPO

## 2018-06-14 DIAGNOSIS — E7800 Pure hypercholesterolemia, unspecified: Secondary | ICD-10-CM

## 2018-06-14 DIAGNOSIS — I10 Essential (primary) hypertension: Secondary | ICD-10-CM | POA: Diagnosis not present

## 2018-06-14 LAB — LIPID PANEL
Cholesterol: 194 mg/dL (ref 0–200)
HDL: 62.2 mg/dL (ref >39.00)
NonHDL: 131.89
Total CHOL/HDL Ratio: 3
Triglycerides: 209 mg/dL — ABNORMAL HIGH (ref 0.0–149.0)
VLDL: 41.8 mg/dL — ABNORMAL HIGH (ref 0.0–40.0)

## 2018-06-14 LAB — HEPATIC FUNCTION PANEL
ALK PHOS: 72 U/L (ref 39–117)
ALT: 16 U/L (ref 0–35)
AST: 19 U/L (ref 0–37)
Albumin: 4.4 g/dL (ref 3.5–5.2)
BILIRUBIN DIRECT: 0.2 mg/dL (ref 0.0–0.3)
BILIRUBIN TOTAL: 1.4 mg/dL — AB (ref 0.2–1.2)
Total Protein: 7.1 g/dL (ref 6.0–8.3)

## 2018-06-14 LAB — BASIC METABOLIC PANEL
BUN: 20 mg/dL (ref 6–23)
CALCIUM: 9.6 mg/dL (ref 8.4–10.5)
CHLORIDE: 101 meq/L (ref 96–112)
CO2: 32 mEq/L (ref 19–32)
CREATININE: 0.76 mg/dL (ref 0.40–1.20)
GFR: 82.47 mL/min (ref >60.00)
Glucose, Bld: 94 mg/dL (ref 70–99)
POTASSIUM: 4.2 meq/L (ref 3.5–5.1)
Sodium: 139 mEq/L (ref 135–145)

## 2018-06-14 LAB — TSH: TSH: 1.35 u[IU]/mL (ref 0.35–4.50)

## 2018-06-14 LAB — LDL CHOLESTEROL, DIRECT: Direct LDL: 112 mg/dL

## 2018-06-21 NOTE — Telephone Encounter (Signed)
Opened in error

## 2018-06-28 ENCOUNTER — Telehealth: Payer: Self-pay

## 2018-06-28 NOTE — Telephone Encounter (Signed)
Please notify pt that her medication is on backorder and she can either try another pharmacy and see if they have the medication or we can change to an equivalent medication if she agrees.  Just let me know.

## 2018-06-28 NOTE — Telephone Encounter (Signed)
[facility] states Losartan-HCTS 100-25 mg is on backorder/unavailable , suggested alternatives olmesartan-HCTZ 40-25mg  , telmisartan-HCTZ 80-25mg , valsartan-HCTZ 160-25mg , Candesartan-HCTZ 32-25mg 

## 2018-06-29 MED ORDER — LOSARTAN POTASSIUM-HCTZ 100-25 MG PO TABS: 1.0000 | ORAL_TABLET | Freq: Every day | ORAL | 0 refills | Status: DC

## 2018-06-29 NOTE — Telephone Encounter (Signed)
Spoke with patient she states she would like to stay on same medication she just wants it called into another pharmacy . Called in script to [facility] [address] per patients request.

## 2018-09-10 ENCOUNTER — Ambulatory Visit: Payer: BC Managed Care – PPO | Admitting: Internal Medicine

## 2018-09-10 ENCOUNTER — Encounter: Payer: Self-pay | Admitting: Internal Medicine

## 2018-09-10 VITALS — BP 124/76 | HR 66 | Temp 97.9°F | Ht 63.5 in | Wt 131.0 lb

## 2018-09-10 DIAGNOSIS — Z0000 Encounter for general adult medical examination without abnormal findings: Secondary | ICD-10-CM | POA: Diagnosis not present

## 2018-09-10 DIAGNOSIS — R739 Hyperglycemia, unspecified: Secondary | ICD-10-CM

## 2018-09-10 DIAGNOSIS — I10 Essential (primary) hypertension: Secondary | ICD-10-CM

## 2018-09-10 DIAGNOSIS — E7800 Pure hypercholesterolemia, unspecified: Secondary | ICD-10-CM

## 2018-09-10 DIAGNOSIS — F439 Reaction to severe stress, unspecified: Secondary | ICD-10-CM

## 2018-09-10 NOTE — Progress Notes (Signed)
Patient ID: Melinda Barber, female   DOB: Apr 06, 1956, 62 y.o.   MRN: [id number]   Subjective:    Patient ID: Melinda Barber, female    DOB: 1956-01-27, 62 y.o.   MRN: [id number]  HPI  Patient here for a scheduled follow up.  She reports she is doing relatively well.  Increased stress with some family issues. Discussed with her today.  Overall she feels she is handling things relatively well.  Has good support.  Does not feel needs anything more at this time.  Tries to stay active.  No chest pain.  No sob. No acid reflux.  No abdominal pain.  Bowels moving.  Blood pressure has been doing well.     Past Medical History:  Diagnosis Date  . Hypercholesterolemia   . Hypertension    History reviewed. No pertinent surgical history. Family History  Problem Relation Age of Onset  . Breast cancer Other 47  . Hypertension Mother   . Hypercholesterolemia Mother   . Diabetes Mother   . Transient ischemic attack Mother   . Aneurysm Father   . Hypertension Brother   . Aneurysm Brother        heart  . Colon cancer Neg Hx    Social History   Socioeconomic History  . Marital status: Married    Spouse name: Not on file  . Number of children: 3  . Years of education: Not on file  . Highest education level: Not on file  Occupational History  . Not on file  Social Needs  . Financial resource strain: Not on file  . Food insecurity:    Worry: Not on file    Inability: Not on file  . Transportation needs:    Medical: Not on file    Non-medical: Not on file  Tobacco Use  . Smoking status: Former Smoker    Last attempt to quit: 09/20/1991    Years since quitting: 27.0  . Smokeless tobacco: Never Used  Substance and Sexual Activity  . Alcohol use: Yes    Alcohol/week: 0.0 standard drinks  . Drug use: No  . Sexual activity: Not on file  Lifestyle  . Physical activity:    Days per week: Not on file    Minutes per session: Not on file  . Stress: Not on file  Relationships  . Social  connections:    Talks on phone: Not on file    Gets together: Not on file    Attends religious service: Not on file    Active member of club or organization: Not on file    Attends meetings of clubs or organizations: Not on file    Relationship status: Not on file  Other Topics Concern  . Not on file  Social History Narrative  . Not on file    Outpatient Encounter Medications as of 09/10/2018  Medication Sig  . amLODipine (NORVASC) 10 MG tablet TAKE 1 TABLET (10 MG TOTAL) BY MOUTH DAILY.  Marland Kitchen aspirin EC 81 MG tablet Take 81 mg by mouth daily.  Marland Kitchen losartan-hydrochlorothiazide (HYZAAR) 100-25 MG tablet Take 1 tablet by mouth daily.  . metoprolol succinate (TOPROL-XL) 25 MG 24 hr tablet TAKE 1 TABLET (25 MG TOTAL) BY MOUTH DAILY.  . Multiple Vitamin (MULTIVITAMIN) capsule Take 1 capsule by mouth daily.  . [DISCONTINUED] clotrimazole-betamethasone (LOTRISONE) cream Apply 1 application topically 2 (two) times daily. (Patient not taking: Reported on 09/10/2018)   No facility-administered encounter medications on file as of 09/10/2018.  Review of Systems  Constitutional: Negative for appetite change and unexpected weight change.  HENT: Negative for congestion and sinus pressure.   Respiratory: Negative for cough, chest tightness and shortness of breath.   Cardiovascular: Negative for chest pain, palpitations and leg swelling.  Gastrointestinal: Negative for abdominal pain, diarrhea, nausea and vomiting.  Genitourinary: Negative for difficulty urinating and dysuria.  Musculoskeletal: Negative for joint swelling and myalgias.  Skin: Negative for color change and rash.  Neurological: Negative for dizziness, light-headedness and headaches.  Psychiatric/Behavioral: Negative for agitation and dysphoric mood.       Objective:    Physical Exam Constitutional:      General: She is not in acute distress.    Appearance: Normal appearance.  HENT:     Nose: Nose normal. No congestion.      Mouth/Throat:     Pharynx: No oropharyngeal exudate or posterior oropharyngeal erythema.  Neck:     Musculoskeletal: Neck supple. No muscular tenderness.     Thyroid: No thyromegaly.  Cardiovascular:     Rate and Rhythm: Normal rate and regular rhythm.  Pulmonary:     Effort: No respiratory distress.     Breath sounds: Normal breath sounds. No wheezing.  Abdominal:     General: Bowel sounds are normal.     Palpations: Abdomen is soft.     Tenderness: There is no abdominal tenderness.  Musculoskeletal:        General: No swelling or tenderness.  Lymphadenopathy:     Cervical: No cervical adenopathy.  Skin:    Findings: No erythema or rash.  Neurological:     Mental Status: She is alert.  Psychiatric:        Mood and Affect: Mood normal.        Thought Content: Thought content normal.     BP 124/76 (BP Location: Left Arm, Patient Position: Sitting, Cuff Size: Normal)   Pulse 66   Temp 97.9 F (36.6 C)   Ht 5' 3.5" (1.613 m)   Wt 131 lb (59.4 kg)   LMP 06/28/2006   SpO2 98%   BMI 22.84 kg/m  Wt Readings from Last 3 Encounters:  09/10/18 131 lb (59.4 kg)  03/12/18 133 lb (60.3 kg)  06/19/17 130 lb (59 kg)     Lab Results  Component Value Date   WBC 5.3 12/04/2017   HGB 13.8 12/04/2017   HCT 40.5 12/04/2017   PLT 196.0 12/04/2017   GLUCOSE 94 06/14/2018   CHOL 194 06/14/2018   TRIG 209.0 (H) 06/14/2018   HDL 62.20 06/14/2018   LDLDIRECT 112.0 06/14/2018   LDLCALC 92 12/04/2017   ALT 16 06/14/2018   AST 19 06/14/2018   NA 139 06/14/2018   K 4.2 06/14/2018   CL 101 06/14/2018   CREATININE 0.76 06/14/2018   BUN 20 06/14/2018   CO2 32 06/14/2018   TSH 1.35 06/14/2018   HGBA1C 5.3 12/04/2017    Dg Bone Density  Result Date: 05/09/2018 EXAM: DUAL X-RAY ABSORPTIOMETRY (DXA) FOR BONE MINERAL DENSITY IMPRESSION: Dear Dr Melinda Barber, Your patient Melinda Barber completed a BMD test on 05/09/2018 using the [employer] Prodigy Advance DXA System (analysis version:  14.10) manufactured by [employer]. The following summarizes the results of our evaluation. PATIENT BIOGRAPHICAL: Name: Melinda Barber, Melinda Barber Patient ID: [id number] Birth Date: 04-Jan-1956 Height: 62.0 in. Gender: Female Exam Date: 05/09/2018 Weight: 132.0 lbs. Indications: Caucasian, Low Body Weight, POSTmenopausal Fractures: Treatments: 81 MG ASPIRIN ASSESSMENT: The BMD measured at Femur Neck Left is 0.831 g/cm2 with  a T-score of -1.5. This patient is considered osteopenic according to World Health Organization [facility]) criteria. Site Region Measured Measured WHO Young Adult BMD Date       Age      Classification T-score AP Spine L1-L4 05/09/2018 60.0 Normal -0.8 1.081 g/cm2 DualFemur Neck Left 05/09/2018 60.0 Osteopenia -1.5 0.831 g/cm2 Left Forearm Radius 33% 05/09/2018 60.0 Osteopenia -1.2 0.767 g/cm2 World Health Organization [facility]) criteria for post-menopausal, Caucasian Women: Normal:       T-score at or above -1 SD Osteopenia:   T-score between -1 and -2.5 SD Osteoporosis: T-score at or below -2.5 SD RECOMMENDATIONS: 1. All patients should optimize calcium and vitamin D intake. 2. Consider FDA-approved medical therapies in postmenopausal women and men aged 21 years and older, based on the following: a. A hip or vertebral (clinical or morphometric) fracture b. T-score < -2.5 at the femoral neck or spine after appropriate evaluation to exclude secondary causes c. Low bone mass (T-score between -1.0 and -2.5 at the femoral neck or spine) and a 10-year probability of a hip fracture > 3% or a 10-year probability of a major osteoporosis-related fracture > 20% based on the [place]-adapted WHO algorithm d. Clinician judgment and/or patient preferences may indicate treatment for people with 10-year fracture probabilities above or below these levels FOLLOW-UP: Patients with diagnosed cases of osteoporosis or at high risk for fracture should have regular bone mineral density tests. For patients eligible for Medicare, routine testing  is allowed once every 2 years. The testing frequency can be increased to one year for patients who have rapidly progressing disease, those who are receiving or discontinuing medical therapy to restore bone mass, or have additional risk factors. I have reviewed this report, and agree with the above findings. [facility] Radiology Dear Dr Melinda [place], Your patient Verneda Hollopeter completed a FRAX assessment on 05/09/2018 using the [facility] Prodigy Advance DXA System (analysis version: 14.10) manufactured by [employer]. The following summarizes the results of our evaluation. PATIENT BIOGRAPHICAL: Name: Laquan, Ludden Patient ID: [id number] Birth Date: 1955-10-26 Height:    62.0 in. Gender:     Female    [age]:        [age]       Weight:    132.0 lbs. Ethnicity:  White                            Exam Date: 05/09/2018 FRAX* RESULTS:  (version: 3.5) 10-year Probability of Fracture1 Major Osteoporotic Fracture2 Hip Fracture 8.0% 0.7% Population: [place] (Caucasian) Risk Factors: None Based on Femur (Right) Neck BMD 1 -The 10-year probability of fracture may be lower than reported if the patient has received treatment. 2 -Major Osteoporotic Fracture: Clinical Spine, Forearm, Hip or Shoulder *FRAX is a Armed forces logistics/support/administrative officer of the [employer] for Metabolic Bone Disease, a World Science writer (WHO) [employer]. ASSESSMENT: The probability of a major osteoporotic fracture is 8.0%within the next ten years. The probability of a hip fracture is 0.7% within the next ten years. Electronically Signed   By: Bretta Bang III M.D.   On: 05/09/2018 16:33   Mm 3d Screen Breast Bilateral  Result Date: 05/09/2018 CLINICAL DATA:  Screening. EXAM: DIGITAL SCREENING BILATERAL MAMMOGRAM WITH TOMO AND CAD COMPARISON:  Previous exam(s). ACR Breast Density Category b: There are scattered areas of fibroglandular density. FINDINGS: There are no findings suspicious for malignancy. Images were processed with  CAD. IMPRESSION: No mammographic evidence of malignancy. A  result letter of this screening mammogram will be mailed directly to the patient. RECOMMENDATION: Screening mammogram in one year. (Code:SM-B-01Y) BI-RADS CATEGORY  1: Negative. Electronically Signed   By: Bary RichardStan  Maynard M.D.   On: 05/09/2018 16:28       Assessment & Plan:   Problem List Items Addressed This Visit    Health care maintenance   Hypercholesterolemia    Low cholesterol diet and exercise.  Follow lipid panel.        Relevant Medications   aspirin EC 81 MG tablet   Other Relevant Orders   Hepatic function panel   Lipid panel   Hypertension    Blood pressure under good control.  Continue same medication regimen.  Follow pressures.  Follow metabolic panel.        Relevant Medications   aspirin EC 81 MG tablet   Other Relevant Orders   CBC with Differential/Platelet   Basic metabolic panel   Stress    Increased stress as outlined.  Discussed with her today.  She does not feel needs any intervention at this time.  Follow.         Other Visit Diagnoses    Hyperglycemia    -  Primary   Relevant Orders   Hemoglobin A1c       Melinda Durhamharlene Bryella Diviney, MD

## 2018-09-15 ENCOUNTER — Encounter: Payer: Self-pay | Admitting: Internal Medicine

## 2018-09-15 NOTE — Assessment & Plan Note (Signed)
Blood pressure under good control.  Continue same medication regimen.  Follow pressures.  Follow metabolic panel.   

## 2018-09-15 NOTE — Assessment & Plan Note (Signed)
Increased stress as outlined.  Discussed with her today.  She does not feel needs any intervention at this time.  Follow.

## 2018-09-15 NOTE — Assessment & Plan Note (Signed)
Low cholesterol diet and exercise.  Follow lipid panel.   

## 2018-12-03 ENCOUNTER — Other Ambulatory Visit: Payer: Self-pay

## 2018-12-03 ENCOUNTER — Other Ambulatory Visit (INDEPENDENT_AMBULATORY_CARE_PROVIDER_SITE_OTHER): Payer: BC Managed Care – PPO

## 2018-12-03 ENCOUNTER — Encounter: Payer: Self-pay | Admitting: Internal Medicine

## 2018-12-03 DIAGNOSIS — I10 Essential (primary) hypertension: Secondary | ICD-10-CM

## 2018-12-03 DIAGNOSIS — E7800 Pure hypercholesterolemia, unspecified: Secondary | ICD-10-CM | POA: Diagnosis not present

## 2018-12-03 DIAGNOSIS — R739 Hyperglycemia, unspecified: Secondary | ICD-10-CM

## 2018-12-03 LAB — HEPATIC FUNCTION PANEL
ALT: 18 U/L (ref 0–35)
AST: 21 U/L (ref 0–37)
Albumin: 4.6 g/dL (ref 3.5–5.2)
Alkaline Phosphatase: 74 U/L (ref 39–117)
BILIRUBIN DIRECT: 0.3 mg/dL (ref 0.0–0.3)
Total Bilirubin: 1.6 mg/dL — ABNORMAL HIGH (ref 0.2–1.2)
Total Protein: 7.3 g/dL (ref 6.0–8.3)

## 2018-12-03 LAB — BASIC METABOLIC PANEL
BUN: 15 mg/dL (ref 6–23)
CALCIUM: 9.6 mg/dL (ref 8.4–10.5)
CHLORIDE: 100 meq/L (ref 96–112)
CO2: 29 meq/L (ref 19–32)
Creatinine, Ser: 0.7 mg/dL (ref 0.40–1.20)
GFR: 85.18 mL/min (ref >60.00)
Glucose, Bld: 105 mg/dL — ABNORMAL HIGH (ref 70–99)
Potassium: 3.6 mEq/L (ref 3.5–5.1)
SODIUM: 138 meq/L (ref 135–145)

## 2018-12-03 LAB — CBC WITH DIFFERENTIAL/PLATELET
Basophils Absolute: 0 10*3/uL (ref 0.0–0.1)
Basophils Relative: 0.8 % (ref 0.0–3.0)
Eosinophils Absolute: 0.2 10*3/uL (ref 0.0–0.7)
Eosinophils Relative: 4.4 % (ref 0.0–5.0)
HEMATOCRIT: 42.8 % (ref 36.0–46.0)
Hemoglobin: 14.6 g/dL (ref 12.0–15.0)
LYMPHS ABS: 1.9 10*3/uL (ref 0.7–4.0)
LYMPHS PCT: 41.1 % (ref 12.0–46.0)
MCHC: 34.2 g/dL (ref 30.0–36.0)
MCV: 93.2 fl (ref 78.0–100.0)
Monocytes Absolute: 0.4 10*3/uL (ref 0.1–1.0)
Monocytes Relative: 8.9 % (ref 3.0–12.0)
NEUTROS ABS: 2.1 10*3/uL (ref 1.4–7.7)
NEUTROS PCT: 44.8 % (ref 43.0–77.0)
PLATELETS: 218 10*3/uL (ref 150.0–400.0)
RBC: 4.59 Mil/uL (ref 3.87–5.11)
RDW: 12.9 % (ref 11.5–15.5)
WBC: 4.6 10*3/uL (ref 4.0–10.5)

## 2018-12-03 LAB — LIPID PANEL
CHOL/HDL RATIO: 3
Cholesterol: 193 mg/dL (ref 0–200)
HDL: 68.7 mg/dL (ref >39.00)
LDL CALC: 99 mg/dL (ref 0–99)
NonHDL: 124.35
Triglycerides: 125 mg/dL (ref 0.0–149.0)
VLDL: 25 mg/dL (ref 0.0–40.0)

## 2018-12-03 LAB — HEMOGLOBIN A1C: Hgb A1c MFr Bld: 5.4 % (ref 4.6–6.5)

## 2018-12-11 ENCOUNTER — Telehealth: Payer: Self-pay | Admitting: Internal Medicine

## 2018-12-11 NOTE — Telephone Encounter (Signed)
This message is referring to GI referral for colonoscopy.  Pt is overdue.

## 2018-12-11 NOTE — Telephone Encounter (Signed)
-----   Message from Allean Found, CMA sent at 12/11/2018  5:00 PM EDT ----- Pt stated she wanted to wait on that referral to be ordered at her next OV with you.    Nina,cma

## 2018-12-25 ENCOUNTER — Other Ambulatory Visit: Payer: Self-pay | Admitting: Internal Medicine

## 2019-03-15 ENCOUNTER — Ambulatory Visit (INDEPENDENT_AMBULATORY_CARE_PROVIDER_SITE_OTHER): Payer: BC Managed Care – PPO | Admitting: Internal Medicine

## 2019-03-15 ENCOUNTER — Encounter: Payer: Self-pay | Admitting: Internal Medicine

## 2019-03-15 ENCOUNTER — Other Ambulatory Visit: Payer: Self-pay

## 2019-03-15 VITALS — BP 118/70 | HR 67 | Temp 98.3°F | Resp 16 | Wt 135.0 lb

## 2019-03-15 DIAGNOSIS — D582 Other hemoglobinopathies: Secondary | ICD-10-CM

## 2019-03-15 DIAGNOSIS — Z1211 Encounter for screening for malignant neoplasm of colon: Secondary | ICD-10-CM

## 2019-03-15 DIAGNOSIS — F439 Reaction to severe stress, unspecified: Secondary | ICD-10-CM

## 2019-03-15 DIAGNOSIS — Z1239 Encounter for other screening for malignant neoplasm of breast: Secondary | ICD-10-CM | POA: Diagnosis not present

## 2019-03-15 DIAGNOSIS — I10 Essential (primary) hypertension: Secondary | ICD-10-CM

## 2019-03-15 DIAGNOSIS — Z0000 Encounter for general adult medical examination without abnormal findings: Secondary | ICD-10-CM

## 2019-03-15 DIAGNOSIS — R739 Hyperglycemia, unspecified: Secondary | ICD-10-CM

## 2019-03-15 DIAGNOSIS — E7800 Pure hypercholesterolemia, unspecified: Secondary | ICD-10-CM

## 2019-03-15 NOTE — Assessment & Plan Note (Signed)
Physical today 03/15/19.  PAP 03/12/18 - negative with negative HPV.  Mammogram 05/09/18 - BiradsI.  Colonoscopy 06/26/14 - one 69mm polyp otherwise normal.

## 2019-03-15 NOTE — Progress Notes (Addendum)
Patient ID: Melinda Barber, female   DOB: 04/25/1956, 63 y.o.   MRN: [id number]   Subjective:    Patient ID: Melinda Barber, female    DOB: 11/02/1955, [age] y.o.   MRN: [id number]  HPI  Patient here for her physical exam.  She reports she is doing well.  Feels good.  Staying active.  No chest pain.  No sob.  No acid reflux.  No abdominal pain.  Bowels moving.  Trying to stay in due to covid restrictions.  No fever.  No chest congestion or cough.  Stress is better.    Past Medical History:  Diagnosis Date  . Hypercholesterolemia   . Hypertension    History reviewed. No pertinent surgical history. Family History  Problem Relation Age of Onset  . Breast cancer Other 47  . Hypertension Mother   . Hypercholesterolemia Mother   . Diabetes Mother   . Transient ischemic attack Mother   . Aneurysm Father   . Hypertension Brother   . Aneurysm Brother        heart  . Colon cancer Neg Hx    Social History   Socioeconomic History  . Marital status: Married    Spouse name: Not on file  . Number of children: 3  . Years of education: Not on file  . Highest education level: Not on file  Occupational History  . Not on file  Social Needs  . Financial resource strain: Not on file  . Food insecurity    Worry: Not on file    Inability: Not on file  . Transportation needs    Medical: Not on file    Non-medical: Not on file  Tobacco Use  . Smoking status: Former Smoker    Quit date: 09/20/1991    Years since quitting: 27.5  . Smokeless tobacco: Never Used  Substance and Sexual Activity  . Alcohol use: Yes    Alcohol/week: 0.0 standard drinks  . Drug use: No  . Sexual activity: Not on file  Lifestyle  . Physical activity    Days per week: Not on file    Minutes per session: Not on file  . Stress: Not on file  Relationships  . Social Musicianconnections    Talks on phone: Not on file    Gets together: Not on file    Attends religious service: Not on file    Active member of club or  organization: Not on file    Attends meetings of clubs or organizations: Not on file    Relationship status: Not on file  Other Topics Concern  . Not on file  Social History Narrative  . Not on file    Outpatient Encounter Medications as of 03/15/2019  Medication Sig  . amLODipine (NORVASC) 10 MG tablet TAKE 1 TABLET (10 MG TOTAL) BY MOUTH DAILY.  Marland Kitchen. aspirin EC 81 MG tablet Take 81 mg by mouth daily.  Marland Kitchen. losartan-hydrochlorothiazide (HYZAAR) 100-25 MG tablet Take 1 tablet by mouth daily.  . metoprolol succinate (TOPROL-XL) 25 MG 24 hr tablet TAKE 1 TABLET (25 MG TOTAL) BY MOUTH DAILY.  . Multiple Vitamin (MULTIVITAMIN) capsule Take 1 capsule by mouth daily.   No facility-administered encounter medications on file as of 03/15/2019.     Review of Systems  Constitutional: Negative for appetite change and unexpected weight change.  HENT: Negative for congestion and sinus pressure.   Eyes: Negative for pain and visual disturbance.  Respiratory: Negative for cough, chest tightness and shortness of breath.  Cardiovascular: Negative for chest pain, palpitations and leg swelling.  Gastrointestinal: Negative for abdominal pain, diarrhea, nausea and vomiting.  Genitourinary: Negative for difficulty urinating and dysuria.  Musculoskeletal: Negative for joint swelling and myalgias.  Skin: Negative for color change and rash.  Neurological: Negative for dizziness, light-headedness and headaches.  Hematological: Negative for adenopathy. Does not bruise/bleed easily.  Psychiatric/Behavioral: Negative for agitation and dysphoric mood.       Objective:    Physical Exam Constitutional:      General: She is not in acute distress.    Appearance: Normal appearance. She is well-developed.  HENT:     Right Ear: External ear normal.     Left Ear: External ear normal.  Eyes:     General: No scleral icterus.       Right eye: No discharge.        Left eye: No discharge.     Conjunctiva/sclera:  Conjunctivae normal.  Neck:     Musculoskeletal: Neck supple. No muscular tenderness.     Thyroid: No thyromegaly.  Cardiovascular:     Rate and Rhythm: Normal rate and regular rhythm.  Pulmonary:     Effort: No tachypnea, accessory muscle usage or respiratory distress.     Breath sounds: Normal breath sounds. No decreased breath sounds or wheezing.  Chest:     Breasts:        Right: No inverted nipple, mass, nipple discharge or tenderness (no axillary adenopathy).        Left: No inverted nipple, mass, nipple discharge or tenderness (no axilarry adenopathy).  Abdominal:     General: Bowel sounds are normal.     Palpations: Abdomen is soft.     Tenderness: There is no abdominal tenderness.  Musculoskeletal:        General: No swelling or tenderness.  Lymphadenopathy:     Cervical: No cervical adenopathy.  Skin:    Findings: No erythema or rash.  Neurological:     Mental Status: She is alert and oriented to person, place, and time.  Psychiatric:        Mood and Affect: Mood normal.        Behavior: Behavior normal.     BP 118/70   Pulse 67   Temp 98.3 F (36.8 C) (Oral)   Resp 16   Wt 135 lb (61.2 kg)   LMP 06/28/2006   SpO2 98%   BMI 23.54 kg/m  Wt Readings from Last 3 Encounters:  03/15/19 135 lb (61.2 kg)  09/10/18 131 lb (59.4 kg)  03/12/18 133 lb (60.3 kg)     Lab Results  Component Value Date   WBC 4.6 12/03/2018   HGB 14.6 12/03/2018   HCT 42.8 12/03/2018   PLT 218.0 12/03/2018   GLUCOSE 105 (H) 12/03/2018   CHOL 193 12/03/2018   TRIG 125.0 12/03/2018   HDL 68.70 12/03/2018   LDLDIRECT 112.0 06/14/2018   LDLCALC 99 12/03/2018   ALT 18 12/03/2018   AST 21 12/03/2018   NA 138 12/03/2018   K 3.6 12/03/2018   CL 100 12/03/2018   CREATININE 0.70 12/03/2018   BUN 15 12/03/2018   CO2 29 12/03/2018   TSH 1.35 06/14/2018   HGBA1C 5.4 12/03/2018    Dg Bone Density  Result Date: 05/09/2018 EXAM: DUAL X-RAY ABSORPTIOMETRY (DXA) FOR BONE MINERAL  DENSITY IMPRESSION: Dear Dr Einar [place], Your patient Melinda Barber completed a BMD test on 05/09/2018 using the [place] (analysis version: 14.10) manufactured by [employer]. The following summarizes the results of our evaluation. PATIENT BIOGRAPHICAL: Name: Melinda Barber, Melinda Barber Patient ID: [id number] Birth Date: 1956-08-24 Height: 62.0 in. Gender: Female Exam Date: 05/09/2018 Weight: 132.0 lbs. Indications: Caucasian, Low Body Weight, POSTmenopausal Fractures: Treatments: 81 MG ASPIRIN ASSESSMENT: The BMD measured at Femur Neck Left is 0.831 g/cm2 with a T-score of -1.5. This patient is considered osteopenic according to World Health Organization [facility](WHO) criteria. Site Region Measured Measured WHO Young Adult BMD Date       Age      Classification T-score AP Spine L1-L4 05/09/2018 60.0 Normal -0.8 1.081 g/cm2 DualFemur Neck Left 05/09/2018 60.0 Osteopenia -1.5 0.831 g/cm2 Left Forearm Radius 33% 05/09/2018 60.0 Osteopenia -1.2 0.767 g/cm2 World Health Organization [facility](WHO) criteria for post-menopausal, Caucasian Women: Normal:       T-score at or above -1 SD Osteopenia:   T-score between -1 and -2.5 SD Osteoporosis: T-score at or below -2.5 SD RECOMMENDATIONS: 1. All patients should optimize calcium and vitamin D intake. 2. Consider FDA-approved medical therapies in postmenopausal women and men aged 61 years and older, based on the following: a. A hip or vertebral (clinical or morphometric) fracture b. T-score < -2.5 at the femoral neck or spine after appropriate evaluation to exclude secondary causes c. Low bone mass (T-score between -1.0 and -2.5 at the femoral neck or spine) and a 10-year probability of a hip fracture > 3% or a 10-year probability of a major osteoporosis-related fracture > 20% based on the [place]-adapted WHO algorithm d. Clinician judgment and/or patient preferences may indicate treatment for people with 10-year fracture probabilities above or below these levels FOLLOW-UP:  Patients with diagnosed cases of osteoporosis or at high risk for fracture should have regular bone mineral density tests. For patients eligible for Medicare, routine testing is allowed once every 2 years. The testing frequency can be increased to one year for patients who have rapidly progressing disease, those who are receiving or discontinuing medical therapy to restore bone mass, or have additional risk factors. I have reviewed this report, and agree with the above findings. [facility] Dear Dr Dale Durhamharlene Shanece Cochrane, Your patient Melinda Barber completed a FRAX assessment on 05/09/2018 using the [facility] [employer] Advance DXA System (analysis version: 14.10) manufactured by [employer]. The following summarizes the results of our evaluation. PATIENT BIOGRAPHICAL: Name: Melinda Barber, Melinda Barber Patient ID: [id number] Birth Date: 1956-08-24 Height:    62.0 in. Gender:     Female    [age]        62.0       Weight:    132.0 lbs. Ethnicity:  White                            Exam Date: 05/09/2018 FRAX* RESULTS:  (version: 3.5) 10-year Probability of Fracture1 Major Osteoporotic Fracture2 Hip Fracture 8.0% 0.7% Population: [place] (Caucasian) Risk Factors: None Based on Femur (Right) Neck BMD 1 -The 10-year probability of fracture may be lower than reported if the patient has received treatment. 2 -Major Osteoporotic Fracture: Clinical Spine, Forearm, Hip or Shoulder *FRAX is a Armed forces logistics/support/administrative officertrademark of the [employer] for Metabolic Bone Disease, a World Science writerHealth Organization (WHO) [employer]. ASSESSMENT: The probability of a major osteoporotic fracture is 8.0%within the next ten years. The probability of a hip fracture is 0.7% within the next ten years. Electronically Signed   By: Bretta BangWilliam  Woodruff III M.D.   On: 05/09/2018 16:33   Mm 3d Screen Breast Bilateral  Result Date: 05/09/2018 CLINICAL DATA:  Screening. EXAM: DIGITAL SCREENING BILATERAL MAMMOGRAM WITH TOMO AND CAD COMPARISON:  Previous  exam(s). ACR Breast Density Category b: There are scattered areas of fibroglandular density. FINDINGS: There are no findings suspicious for malignancy. Images were processed with CAD. IMPRESSION: No mammographic evidence of malignancy. A result letter of this screening mammogram will be mailed directly to the patient. RECOMMENDATION: Screening mammogram in one year. (Code:SM-B-01Y) BI-RADS CATEGORY  1: Negative. Electronically Signed   By: Bary RichardStan  Maynard M.D.   On: 05/09/2018 16:28       Assessment & Plan:   Problem List Items Addressed This Visit    Elevated hemoglobin (HCC)    Follow cbc.       Health care maintenance    Physical today 03/15/19.  PAP 03/12/18 - negative with negative HPV.  Mammogram 05/09/18 - BiradsI.  Colonoscopy 06/26/14 - one 11mm polyp otherwise normal.        Hyperbilirubinemia    Stable.  Follow liver panel.       Hypercholesterolemia    Low cholesterol diet and exercise.  Follow lipid panel.       Relevant Orders   Hepatic function panel   Lipid panel   Hypertension    Blood pressure doing well.  Follow pressures.  Follow metabolic panel.        Relevant Orders   Basic metabolic panel   Stress    Discussed with her today.  Doing well.  Follow.         Other Visit Diagnoses    Routine general medical examination at a health care facility    -  Primary   Breast cancer screening       Relevant Orders   MM 3D SCREEN BREAST BILATERAL   Hyperglycemia       Relevant Orders   Hemoglobin A1c   Colon cancer screening       Relevant Orders   Ambulatory referral to Gastroenterology       Dale Durhamharlene Akeria Hedstrom, MD

## 2019-03-17 ENCOUNTER — Encounter: Payer: Self-pay | Admitting: Internal Medicine

## 2019-03-17 NOTE — Assessment & Plan Note (Signed)
Follow cbc.  

## 2019-03-17 NOTE — Assessment & Plan Note (Signed)
Discussed with her today.  Doing well.  Follow.   

## 2019-03-17 NOTE — Assessment & Plan Note (Signed)
Low cholesterol diet and exercise.  Follow lipid panel.   

## 2019-03-17 NOTE — Assessment & Plan Note (Signed)
Stable.  Follow liver panel.  

## 2019-03-17 NOTE — Assessment & Plan Note (Signed)
Blood pressure doing well.  Follow pressures.  Follow metabolic panel.  

## 2019-03-17 NOTE — Addendum Note (Signed)
Addended by: Alisa Graff on: 03/17/2019 06:36 PM   Modules accepted: Orders

## 2019-03-23 ENCOUNTER — Other Ambulatory Visit: Payer: Self-pay | Admitting: Internal Medicine

## 2019-03-26 ENCOUNTER — Encounter: Payer: Self-pay | Admitting: Internal Medicine

## 2019-03-27 ENCOUNTER — Other Ambulatory Visit (INDEPENDENT_AMBULATORY_CARE_PROVIDER_SITE_OTHER): Payer: BC Managed Care – PPO

## 2019-03-27 ENCOUNTER — Encounter: Payer: Self-pay | Admitting: Internal Medicine

## 2019-03-27 ENCOUNTER — Other Ambulatory Visit: Payer: Self-pay

## 2019-03-27 DIAGNOSIS — E7800 Pure hypercholesterolemia, unspecified: Secondary | ICD-10-CM | POA: Diagnosis not present

## 2019-03-27 DIAGNOSIS — R739 Hyperglycemia, unspecified: Secondary | ICD-10-CM

## 2019-03-27 DIAGNOSIS — I10 Essential (primary) hypertension: Secondary | ICD-10-CM | POA: Diagnosis not present

## 2019-03-27 LAB — BASIC METABOLIC PANEL
BUN: 17 mg/dL (ref 6–23)
CO2: 30 mEq/L (ref 19–32)
Calcium: 9.4 mg/dL (ref 8.4–10.5)
Chloride: 102 mEq/L (ref 96–112)
Creatinine, Ser: 0.75 mg/dL (ref 0.40–1.20)
GFR: 78.58 mL/min (ref >60.00)
Glucose, Bld: 90 mg/dL (ref 70–99)
Potassium: 4 mEq/L (ref 3.5–5.1)
Sodium: 140 mEq/L (ref 135–145)

## 2019-03-27 LAB — HEMOGLOBIN A1C: Hgb A1c MFr Bld: 5.3 % (ref 4.6–6.5)

## 2019-03-27 LAB — LIPID PANEL
Cholesterol: 185 mg/dL (ref 0–200)
HDL: 64.5 mg/dL (ref >39.00)
LDL Cholesterol: 102 mg/dL — ABNORMAL HIGH (ref 0–99)
NonHDL: 120.07
Total CHOL/HDL Ratio: 3
Triglycerides: 92 mg/dL (ref 0.0–149.0)
VLDL: 18.4 mg/dL (ref 0.0–40.0)

## 2019-03-27 LAB — HEPATIC FUNCTION PANEL
ALT: 18 U/L (ref 0–35)
AST: 21 U/L (ref 0–37)
Albumin: 4.7 g/dL (ref 3.5–5.2)
Alkaline Phosphatase: 76 U/L (ref 39–117)
Bilirubin, Direct: 0.2 mg/dL (ref 0.0–0.3)
Total Bilirubin: 1.4 mg/dL — ABNORMAL HIGH (ref 0.2–1.2)
Total Protein: 7 g/dL (ref 6.0–8.3)

## 2019-03-28 ENCOUNTER — Telehealth: Payer: Self-pay | Admitting: Internal Medicine

## 2019-03-28 ENCOUNTER — Other Ambulatory Visit: Payer: Self-pay

## 2019-03-28 MED ORDER — LOSARTAN POTASSIUM-HCTZ 100-25 MG PO TABS: 1.0000 | ORAL_TABLET | Freq: Every day | ORAL | 2 refills | Status: DC

## 2019-03-28 NOTE — Telephone Encounter (Signed)
rx refilled.

## 2019-03-28 NOTE — Telephone Encounter (Signed)
Medication: losartan-hydrochlorothiazide (HYZAAR) 100-25 MG tablet [[id number]  Has the patient contacted their pharmacy? Yes  (Agent: If no, request that the patient contact the pharmacy for the refill.) (Agent: If yes, when and what did the pharmacy advise?)  Preferred Pharmacy (with phone number or street name): [id number] [address], [place] [phone] (Phone) [phone] (Fax)    Agent: Please be advised that RX refills may take up to 3 business days. We ask that you follow-up with your pharmacy.

## 2019-04-11 ENCOUNTER — Telehealth: Payer: Self-pay

## 2019-04-11 NOTE — Telephone Encounter (Signed)
This was regarding pts mother who is also our patient

## 2019-04-11 NOTE — Telephone Encounter (Deleted)
Did you call this patient?

## 2019-04-11 NOTE — Telephone Encounter (Signed)
Did you call this patient?

## 2019-04-11 NOTE — Telephone Encounter (Signed)
Copied from [place] [id number]. Topic: General - Call Back - No Documentation >> Apr 11, 2019  1:34 PM Melinda Barber wrote: Reason for CRM:  Pt states she is returning a missed call to the office.

## 2019-05-13 ENCOUNTER — Ambulatory Visit
Admission: RE | Admit: 2019-05-13 | Discharge: 2019-05-13 | Disposition: A | Discharge status: Home / Self Care | Payer: BC Managed Care – PPO | Source: Ambulatory Visit | Attending: Internal Medicine | Admitting: Internal Medicine

## 2019-05-13 ENCOUNTER — Other Ambulatory Visit: Payer: Self-pay

## 2019-05-13 DIAGNOSIS — Z1231 Encounter for screening mammogram for malignant neoplasm of breast: Secondary | ICD-10-CM | POA: Insufficient documentation

## 2019-05-13 DIAGNOSIS — Z1239 Encounter for other screening for malignant neoplasm of breast: Secondary | ICD-10-CM | POA: Diagnosis present

## 2019-06-10 LAB — HM COLONOSCOPY

## 2019-07-26 ENCOUNTER — Telehealth: Payer: Self-pay | Admitting: *Deleted

## 2019-07-26 NOTE — Telephone Encounter (Signed)
Copied from [place] [id number]. Topic: Quick Communication - Rx Refill/Question >> Jul 26, 2019  8:28 AM Carolyn Stare wrote: Medication CLOTRIMAZOLE BETAMETHASONE  Preferred Pharmacy [facility]  Agent: Please be advised that RX refills may take up to 3 business days. We ask that you follow-up with your pharmacy.

## 2019-07-29 MED ORDER — CLOTRIMAZOLE-BETAMETHASONE 1-0.05 % EX CREA: 1.0000 "application " | TOPICAL_CREAM | Freq: Two times a day (BID) | CUTANEOUS | 0 refills | Status: DC | PRN

## 2019-07-29 NOTE — Telephone Encounter (Signed)
Called patient to confirm nothing acute going on. She had the lotrisone cream in the past when she got poison oak and also uses when she has bug bites. She likes to keep it on hand. Are you ok with sending in for her?

## 2019-07-29 NOTE — Telephone Encounter (Signed)
rx sent in to [facility].  Just make sure she knows to avoid her face, breast and genital area.

## 2019-07-29 NOTE — Telephone Encounter (Signed)
Patient is aware 

## 2019-09-09 ENCOUNTER — Ambulatory Visit (INDEPENDENT_AMBULATORY_CARE_PROVIDER_SITE_OTHER): Payer: BC Managed Care – PPO | Admitting: Internal Medicine

## 2019-09-09 ENCOUNTER — Other Ambulatory Visit: Payer: Self-pay

## 2019-09-09 ENCOUNTER — Encounter: Payer: Self-pay | Admitting: Internal Medicine

## 2019-09-09 DIAGNOSIS — I10 Essential (primary) hypertension: Secondary | ICD-10-CM | POA: Diagnosis not present

## 2019-09-09 DIAGNOSIS — E7800 Pure hypercholesterolemia, unspecified: Secondary | ICD-10-CM

## 2019-09-09 DIAGNOSIS — D72819 Decreased white blood cell count, unspecified: Secondary | ICD-10-CM

## 2019-09-09 DIAGNOSIS — F439 Reaction to severe stress, unspecified: Secondary | ICD-10-CM

## 2019-09-09 NOTE — Assessment & Plan Note (Signed)
Low cholesterol diet and exercise.  Follow lipid panel.   

## 2019-09-09 NOTE — Progress Notes (Signed)
Patient ID: Melinda Barber, female   DOB: [birth year], 63 y.o.   MRN: [id number]   Virtual Visit via telephone Note  This visit type was conducted due to national recommendations for restrictions regarding the COVID-19 pandemic (e.g. social distancing).  This format is felt to be most appropriate for this patient at this time.  All issues noted in this document were discussed and addressed.  No physical exam was performed (except for noted visual exam findings with Video Visits).   I connected with Melinda Barber by telephone and verified that I am speaking with the correct person using two identifiers. Location patient: home Location provider: work Persons participating in the telephone visit: patient, provider  The limitations, risks, security and privacy concerns of performing an evaluation and management service by telephone and the availability of in person appointments have been discussed.  The patient expressed understanding and agreed to proceed.   Reason for visit: scheduled follow up.   HPI: She reports she is doing relatively well.  Helping to care for her mother.  Handling stress. Does report increased fatigue.  Does not do formal exercise, but does stay physically active.  Is sleeping better.  Feels rested when she wakes up in the am.  Discussed labs.  Will schedule f/u fasting labs.  No chest pain.  No sob.  No acid reflux. No abdominal pain.  Bowels moving.  Just had colonoscopy.  States ok.  Will obtain copy of report.  Was on her feet a lot this past weekend.  Noticed her knees, legs and feet ached.  After elevating legs for brief period - pain resolved.  Overall she feels things are going well.  Discussed shingles vaccine.    ROS: See pertinent positives and negatives per HPI.  Past Medical History:  Diagnosis Date  . Hypercholesterolemia   . Hypertension     History reviewed. No pertinent surgical history.  Family History  Problem Relation Age of Onset  . Breast cancer  Other 47  . Hypertension Mother   . Hypercholesterolemia Mother   . Diabetes Mother   . Transient ischemic attack Mother   . Aneurysm Father   . Hypertension Brother   . Aneurysm Brother        heart  . Colon cancer Neg Hx     SOCIAL HX: reviewed.    Current Outpatient Medications:  .  amLODipine (NORVASC) 10 MG tablet, TAKE 1 TABLET (10 MG TOTAL) BY MOUTH DAILY., Disp: 90 tablet, Rfl: 3 .  aspirin EC 81 MG tablet, Take 81 mg by mouth daily., Disp: , Rfl:  .  clotrimazole-betamethasone (LOTRISONE) cream, Apply 1 application topically 2 (two) times daily as needed., Disp: 30 g, Rfl: 0 .  losartan-hydrochlorothiazide (HYZAAR) 100-25 MG tablet, Take 1 tablet by mouth daily., Disp: 90 tablet, Rfl: 2 .  metoprolol succinate (TOPROL-XL) 25 MG 24 hr tablet, TAKE 1 TABLET BY MOUTH EVERY DAY, Disp: 90 tablet, Rfl: 3 .  Multiple Vitamin (MULTIVITAMIN) capsule, Take 1 capsule by mouth daily., Disp: , Rfl:   EXAM:  GENERAL: alert.  Sounds to be in no acute distress. Answering questions appropriately.   PSYCH/NEURO: pleasant and cooperative, no obvious depression or anxiety, speech and thought processing grossly intact  ASSESSMENT AND PLAN:  Discussed the following assessment and plan:  Hypercholesterolemia Low cholesterol diet and exercise.  Follow lipid panel.    Hypertension Has not been checking blood pressures.  Has not felt was elevated.  Follow pressures.  Check metabolic panel.  Continue current  medication regimen.    Hyperbilirubinemia Total bilirubin stable.  Remainder of liver panel wnl.  Follow.    Leukopenia Recent white blood cell count wnl.    Stress Overall appears to be handling things well.  Follow.      I discussed the assessment and treatment plan with the patient. The patient was provided an opportunity to ask questions and all were answered. The patient agreed with the plan and demonstrated an understanding of the instructions.   The patient was advised to  call back or seek an in-person evaluation if the symptoms worsen or if the condition fails to improve as anticipated.  I provided 25 minutes of non-face-to-face time during this encounter.   Dale [place], MD

## 2019-09-09 NOTE — Assessment & Plan Note (Signed)
Has not been checking blood pressures.  Has not felt was elevated.  Follow pressures.  Check metabolic panel.  Continue current medication regimen.

## 2019-09-14 NOTE — Assessment & Plan Note (Signed)
Total bilirubin stable.  Remainder of liver panel wnl.  Follow.

## 2019-09-14 NOTE — Assessment & Plan Note (Signed)
Recent white blood cell count wnl.   

## 2019-09-14 NOTE — Assessment & Plan Note (Signed)
Overall appears to be handling things well.  Follow.  ?

## 2019-10-14 ENCOUNTER — Other Ambulatory Visit: Payer: Self-pay

## 2019-10-14 ENCOUNTER — Other Ambulatory Visit (INDEPENDENT_AMBULATORY_CARE_PROVIDER_SITE_OTHER): Payer: BC Managed Care – PPO

## 2019-10-14 DIAGNOSIS — E7800 Pure hypercholesterolemia, unspecified: Secondary | ICD-10-CM

## 2019-10-14 DIAGNOSIS — I10 Essential (primary) hypertension: Secondary | ICD-10-CM

## 2019-10-14 LAB — HEPATIC FUNCTION PANEL
ALT: 17 U/L (ref 0–35)
AST: 19 U/L (ref 0–37)
Albumin: 4.6 g/dL (ref 3.5–5.2)
Alkaline Phosphatase: 86 U/L (ref 39–117)
Bilirubin, Direct: 0.1 mg/dL (ref 0.0–0.3)
Total Bilirubin: 0.8 mg/dL (ref 0.2–1.2)
Total Protein: 7 g/dL (ref 6.0–8.3)

## 2019-10-14 LAB — LIPID PANEL
Cholesterol: 200 mg/dL (ref 0–200)
HDL: 59.6 mg/dL (ref >39.00)
LDL Cholesterol: 110 mg/dL — ABNORMAL HIGH (ref 0–99)
NonHDL: 140.65
Total CHOL/HDL Ratio: 3
Triglycerides: 155 mg/dL — ABNORMAL HIGH (ref 0.0–149.0)
VLDL: 31 mg/dL (ref 0.0–40.0)

## 2019-10-14 LAB — BASIC METABOLIC PANEL
BUN: 19 mg/dL (ref 6–23)
CO2: 31 mEq/L (ref 19–32)
Calcium: 9.7 mg/dL (ref 8.4–10.5)
Chloride: 102 mEq/L (ref 96–112)
Creatinine, Ser: 0.84 mg/dL (ref 0.40–1.20)
GFR: 68.82 mL/min (ref >60.00)
Glucose, Bld: 97 mg/dL (ref 70–99)
Potassium: 4.4 mEq/L (ref 3.5–5.1)
Sodium: 140 mEq/L (ref 135–145)

## 2019-10-14 LAB — TSH: TSH: 1.59 u[IU]/mL (ref 0.35–4.50)

## 2019-10-16 ENCOUNTER — Encounter: Payer: Self-pay | Admitting: Internal Medicine

## 2019-12-23 ENCOUNTER — Other Ambulatory Visit: Payer: Self-pay | Admitting: Internal Medicine

## 2019-12-24 ENCOUNTER — Other Ambulatory Visit: Payer: Self-pay | Admitting: Internal Medicine

## 2020-03-16 ENCOUNTER — Other Ambulatory Visit: Payer: Self-pay

## 2020-03-16 ENCOUNTER — Ambulatory Visit (INDEPENDENT_AMBULATORY_CARE_PROVIDER_SITE_OTHER): Payer: BC Managed Care – PPO | Admitting: Internal Medicine

## 2020-03-16 VITALS — BP 116/70 | HR 60 | Temp 97.7°F | Resp 16 | Ht 64.0 in | Wt 136.0 lb

## 2020-03-16 DIAGNOSIS — D582 Other hemoglobinopathies: Secondary | ICD-10-CM

## 2020-03-16 DIAGNOSIS — Z0000 Encounter for general adult medical examination without abnormal findings: Secondary | ICD-10-CM | POA: Diagnosis not present

## 2020-03-16 DIAGNOSIS — I10 Essential (primary) hypertension: Secondary | ICD-10-CM

## 2020-03-16 DIAGNOSIS — E7800 Pure hypercholesterolemia, unspecified: Secondary | ICD-10-CM | POA: Diagnosis not present

## 2020-03-16 DIAGNOSIS — F439 Reaction to severe stress, unspecified: Secondary | ICD-10-CM

## 2020-03-16 DIAGNOSIS — D72819 Decreased white blood cell count, unspecified: Secondary | ICD-10-CM

## 2020-03-16 DIAGNOSIS — Z1231 Encounter for screening mammogram for malignant neoplasm of breast: Secondary | ICD-10-CM | POA: Diagnosis not present

## 2020-03-16 DIAGNOSIS — R251 Tremor, unspecified: Secondary | ICD-10-CM

## 2020-03-16 DIAGNOSIS — R1900 Intra-abdominal and pelvic swelling, mass and lump, unspecified site: Secondary | ICD-10-CM

## 2020-03-16 LAB — URINALYSIS, ROUTINE W REFLEX MICROSCOPIC
Bilirubin Urine: NEGATIVE
Hgb urine dipstick: NEGATIVE
Ketones, ur: NEGATIVE
Leukocytes,Ua: NEGATIVE
Nitrite: NEGATIVE
Specific Gravity, Urine: 1.02 (ref 1.000–1.030)
Total Protein, Urine: NEGATIVE
Urine Glucose: NEGATIVE
Urobilinogen, UA: 0.2 (ref 0.0–1.0)
WBC, UA: NONE SEEN (ref 0–?)
pH: 6 (ref 5.0–8.0)

## 2020-03-16 LAB — LIPID PANEL
Cholesterol: 193 mg/dL (ref 0–200)
HDL: 66.9 mg/dL (ref >39.00)
LDL Cholesterol: 104 mg/dL — ABNORMAL HIGH (ref 0–99)
NonHDL: 126.12
Total CHOL/HDL Ratio: 3
Triglycerides: 113 mg/dL (ref 0.0–149.0)
VLDL: 22.6 mg/dL (ref 0.0–40.0)

## 2020-03-16 LAB — CBC WITH DIFFERENTIAL/PLATELET
Basophils Absolute: 0 10*3/uL (ref 0.0–0.1)
Basophils Relative: 0.8 % (ref 0.0–3.0)
Eosinophils Absolute: 0.1 10*3/uL (ref 0.0–0.7)
Eosinophils Relative: 1.6 % (ref 0.0–5.0)
HCT: 43.3 % (ref 36.0–46.0)
Hemoglobin: 14.6 g/dL (ref 12.0–15.0)
Lymphocytes Relative: 33.9 % (ref 12.0–46.0)
Lymphs Abs: 1.5 10*3/uL (ref 0.7–4.0)
MCHC: 33.8 g/dL (ref 30.0–36.0)
MCV: 95.4 fl (ref 78.0–100.0)
Monocytes Absolute: 0.5 10*3/uL (ref 0.1–1.0)
Monocytes Relative: 9.9 % (ref 3.0–12.0)
Neutro Abs: 2.5 10*3/uL (ref 1.4–7.7)
Neutrophils Relative %: 53.8 % (ref 43.0–77.0)
Platelets: 214 10*3/uL (ref 150.0–400.0)
RBC: 4.54 Mil/uL (ref 3.87–5.11)
RDW: 13 % (ref 11.5–15.5)
WBC: 4.6 10*3/uL (ref 4.0–10.5)

## 2020-03-16 LAB — BASIC METABOLIC PANEL
BUN: 14 mg/dL (ref 6–23)
CO2: 30 mEq/L (ref 19–32)
Calcium: 9.8 mg/dL (ref 8.4–10.5)
Chloride: 101 mEq/L (ref 96–112)
Creatinine, Ser: 0.74 mg/dL (ref 0.40–1.20)
GFR: 79.55 mL/min (ref >60.00)
Glucose, Bld: 97 mg/dL (ref 70–99)
Potassium: 4.2 mEq/L (ref 3.5–5.1)
Sodium: 139 mEq/L (ref 135–145)

## 2020-03-16 LAB — HEPATIC FUNCTION PANEL
ALT: 22 U/L (ref 0–35)
AST: 22 U/L (ref 0–37)
Albumin: 4.8 g/dL (ref 3.5–5.2)
Alkaline Phosphatase: 84 U/L (ref 39–117)
Bilirubin, Direct: 0.2 mg/dL (ref 0.0–0.3)
Total Bilirubin: 1.6 mg/dL — ABNORMAL HIGH (ref 0.2–1.2)
Total Protein: 7.1 g/dL (ref 6.0–8.3)

## 2020-03-16 NOTE — Progress Notes (Signed)
Patient ID: Melinda Barber, female   DOB: [birth year], 64 y.o.   MRN: [id number]   Subjective:    Patient ID: Melinda Barber, female    DOB: Jun 18, 1956, 64 y.o.   MRN: [id number]  HPI This visit occurred during the SARS-CoV-2 public health emergency.  Safety protocols were in place, including screening questions prior to the visit, additional usage of staff PPE, and extensive cleaning of exam room while observing appropriate contact time as indicated for disinfecting solutions.  Patient here for her physical exam.  She reports she is doing relatively well.  Increased stress with family medical issues.  Helping to take care of her mother.  Overall handling things well.  Did report she does not cry.  Tries to stay active.  No chest pain or sob reported.  No abdominal pain or bowel change.  Has noticed increased tremor - head/hands.  Also has noticed a "knot) lower abdomen.  No pain.  States goes away when lying down.  Bowels moving.    Past Medical History:  Diagnosis Date  . Hypercholesterolemia   . Hypertension    History reviewed. No pertinent surgical history. Family History  Problem Relation Age of Onset  . Breast cancer Other 47  . Hypertension Mother   . Hypercholesterolemia Mother   . Diabetes Mother   . Transient ischemic attack Mother   . Aneurysm Father   . Hypertension Brother   . Aneurysm Brother        heart  . Colon cancer Neg Hx    Social History   Socioeconomic History  . Marital status: Married    Spouse name: Not on file  . Number of children: 3  . Years of education: Not on file  . Highest education level: Not on file  Occupational History  . Not on file  Tobacco Use  . Smoking status: Former Smoker    Quit date: 09/20/1991    Years since quitting: 28.5  . Smokeless tobacco: Never Used  Substance and Sexual Activity  . Alcohol use: Yes    Alcohol/week: 0.0 standard drinks  . Drug use: No  . Sexual activity: Not on file  Other Topics Concern  . Not on file    Social History Narrative  . Not on file   Social Determinants of Health   Financial Resource Strain:   . Difficulty of Paying Living Expenses:   Food Insecurity:   . Worried About Charity fundraiser in the Last Year:   . Arboriculturist in the Last Year:   Transportation Needs:   . Film/video editor (Medical):   Marland Kitchen Lack of Transportation (Non-Medical):   Physical Activity:   . Days of Exercise per Week:   . Minutes of Exercise per Session:   Stress:   . Feeling of Stress :   Social Connections:   . Frequency of Communication with Friends and Family:   . Frequency of Social Gatherings with Friends and Family:   . Attends Religious Services:   . Active Member of Clubs or Organizations:   . Attends Archivist Meetings:   Marland Kitchen Marital Status:     Outpatient Encounter Medications as of 03/16/2020  Medication Sig  . amLODipine (NORVASC) 10 MG tablet TAKE 1 TABLET BY MOUTH EVERY DAY  . aspirin EC 81 MG tablet Take 81 mg by mouth daily.  . clotrimazole-betamethasone (LOTRISONE) cream Apply 1 application topically 2 (two) times daily as needed.  Marland Kitchen losartan-hydrochlorothiazide (HYZAAR) 100-25 MG tablet TAKE  1 TABLET BY MOUTH DAILY  . metoprolol succinate (TOPROL-XL) 25 MG 24 hr tablet TAKE 1 TABLET BY MOUTH EVERY DAY  . Multiple Vitamin (MULTIVITAMIN) capsule Take 1 capsule by mouth daily.   No facility-administered encounter medications on file as of 03/16/2020.    Review of Systems  Constitutional: Negative for appetite change and unexpected weight change.  HENT: Negative for congestion and sinus pressure.   Eyes: Negative for pain and visual disturbance.  Respiratory: Negative for cough, chest tightness and shortness of breath.   Cardiovascular: Negative for chest pain, palpitations and leg swelling.  Gastrointestinal: Negative for abdominal pain, diarrhea, nausea and vomiting.  Genitourinary: Negative for difficulty urinating and dysuria.  Musculoskeletal:  Negative for joint swelling and myalgias.  Skin: Negative for color change and rash.  Neurological: Negative for dizziness, light-headedness and headaches.  Hematological: Negative for adenopathy. Does not bruise/bleed easily.  Psychiatric/Behavioral: Negative for agitation and dysphoric mood.       Objective:    Physical Exam Constitutional:      General: She is not in acute distress.    Appearance: Normal appearance. She is well-developed.  HENT:     Head: Normocephalic and atraumatic.     Right Ear: External ear normal.     Left Ear: External ear normal.  Eyes:     General: No scleral icterus.       Right eye: No discharge.        Left eye: No discharge.     Conjunctiva/sclera: Conjunctivae normal.  Neck:     Thyroid: No thyromegaly.  Cardiovascular:     Rate and Rhythm: Normal rate and regular rhythm.  Pulmonary:     Effort: No tachypnea, accessory muscle usage or respiratory distress.     Breath sounds: Normal breath sounds. No decreased breath sounds or wheezing.  Chest:     Breasts:        Right: No inverted nipple, mass, nipple discharge or tenderness (no axillary adenopathy).        Left: No inverted nipple, mass, nipple discharge or tenderness (no axilarry adenopathy).  Abdominal:     General: Bowel sounds are normal.     Palpations: Abdomen is soft.     Tenderness: There is no abdominal tenderness.     Comments: Question of lower abdominal/inguinal hernia.  No palpable area when lying down.  Non tender.    Musculoskeletal:        General: No swelling or tenderness.     Cervical back: Neck supple. No tenderness.  Lymphadenopathy:     Cervical: No cervical adenopathy.  Skin:    Findings: No erythema or rash.  Neurological:     Mental Status: She is alert and oriented to person, place, and time.  Psychiatric:        Mood and Affect: Mood normal.        Behavior: Behavior normal.     BP 116/70   Pulse 60   Temp 97.7 F (36.5 C)   Resp 16   Ht 5\' 4"   (1.626 m)   Wt 136 lb (61.7 kg)   LMP 06/28/2006   SpO2 99%   BMI 23.34 kg/m  Wt Readings from Last 3 Encounters:  03/16/20 136 lb (61.7 kg)  03/15/19 135 lb (61.2 kg)  09/10/18 131 lb (59.4 kg)     Lab Results  Component Value Date   WBC 4.6 03/16/2020   HGB 14.6 03/16/2020   HCT 43.3 03/16/2020   PLT 214.0 03/16/2020  GLUCOSE 97 03/16/2020   CHOL 193 03/16/2020   TRIG 113.0 03/16/2020   HDL 66.90 03/16/2020   LDLDIRECT 112.0 06/14/2018   LDLCALC 104 (H) 03/16/2020   ALT 22 03/16/2020   AST 22 03/16/2020   NA 139 03/16/2020   K 4.2 03/16/2020   CL 101 03/16/2020   CREATININE 0.74 03/16/2020   BUN 14 03/16/2020   CO2 30 03/16/2020   TSH 1.59 10/14/2019   HGBA1C 5.3 03/27/2019    MM 3D SCREEN BREAST BILATERAL  Result Date: 05/14/2019 CLINICAL DATA:  Screening. EXAM: DIGITAL SCREENING BILATERAL MAMMOGRAM WITH TOMO AND CAD COMPARISON:  Previous exam(s). ACR Breast Density Category b: There are scattered areas of fibroglandular density. FINDINGS: There are no findings suspicious for malignancy. Images were processed with CAD. IMPRESSION: No mammographic evidence of malignancy. A result letter of this screening mammogram will be mailed directly to the patient. RECOMMENDATION: Screening mammogram in one year. (Code:SM-B-01Y) BI-RADS CATEGORY  1: Negative. Electronically Signed   By: Britta Mccreedy M.D.   On: 05/14/2019 12:08       Assessment & Plan:   Problem List Items Addressed This Visit    Abdominal wall bulge    Question of hernia.  No pain.  Discussed further evaluation.  She declines at this time.  Wants to monitor.        Elevated hemoglobin (HCC)    Recheck cbc.        Health care maintenance    Physical today 03/16/20.  PAP 03/12/18 - negative with negative HPV.  Mammogram 05/14/19 - birads I.  Colonoscopy 06/2014 - one 16mm polyp otherwise normal.  Repeat colonoscopy 05/2019 - diverticulosis and non bleeding internal hemorrhoid.  Obtain copy of report.         Hyperbilirubinemia    Total bilirubin elevated.  Remainder of liver panel has been wnl.  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  Recheck liver panel.        Hypercholesterolemia    Low cholesterol diet and exercise.  Follow lipid panel.        Relevant Orders   Hepatic function panel (Completed)   Lipid panel (Completed)   Hypertension    Blood pressure as outlined.  Continue losartan/hctz.  Follow pressures.  Follow metabolic panel.       Relevant Orders   Urinalysis, Routine w reflex microscopic (Completed)   Basic metabolic panel (Completed)   Leukopenia   Relevant Orders   CBC with Differential/Platelet (Completed)   Stress    Increased stress as outlined.  Overall appears to be handling things relatively well.  Follow.        Tremor    Mild head tremor noted on exam.  FTN intact.  Discussed further evaluation.  She wants to monitor at this time.  Follow.         Other Visit Diagnoses    Visit for screening mammogram    -  Primary   Relevant Orders   MM 3D SCREEN BREAST BILATERAL       Dale [place], MD

## 2020-03-23 ENCOUNTER — Other Ambulatory Visit: Payer: Self-pay | Admitting: Internal Medicine

## 2020-03-23 ENCOUNTER — Encounter: Payer: Self-pay | Admitting: Internal Medicine

## 2020-03-23 DIAGNOSIS — R251 Tremor, unspecified: Secondary | ICD-10-CM | POA: Insufficient documentation

## 2020-03-23 DIAGNOSIS — R1900 Intra-abdominal and pelvic swelling, mass and lump, unspecified site: Secondary | ICD-10-CM | POA: Insufficient documentation

## 2020-03-23 DIAGNOSIS — K4090 Unilateral inguinal hernia, without obstruction or gangrene, not specified as recurrent: Secondary | ICD-10-CM | POA: Insufficient documentation

## 2020-03-23 NOTE — Assessment & Plan Note (Signed)
Question of hernia.  No pain.  Discussed further evaluation.  She declines at this time.  Wants to monitor.

## 2020-03-23 NOTE — Assessment & Plan Note (Signed)
Increased stress as outlined.  Overall appears to be handling things relatively well.  Follow.  

## 2020-03-23 NOTE — Assessment & Plan Note (Signed)
Total bilirubin elevated.  Remainder of liver panel has been wnl.  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  Recheck liver panel.

## 2020-03-23 NOTE — Assessment & Plan Note (Signed)
Mild head tremor noted on exam.  FTN intact.  Discussed further evaluation.  She wants to monitor at this time.  Follow.

## 2020-03-23 NOTE — Assessment & Plan Note (Signed)
Recheck cbc.  

## 2020-03-23 NOTE — Assessment & Plan Note (Signed)
Low cholesterol diet and exercise.  Follow lipid panel.   

## 2020-03-23 NOTE — Assessment & Plan Note (Signed)
Blood pressure as outlined.  Continue losartan/hctz.  Follow pressures.  Follow metabolic panel.  

## 2020-03-23 NOTE — Assessment & Plan Note (Signed)
Physical today 03/16/20.  PAP 03/12/18 - negative with negative HPV.  Mammogram 05/14/19 - birads I.  Colonoscopy 06/2014 - one 71mm polyp otherwise normal.  Repeat colonoscopy 05/2019 - diverticulosis and non bleeding internal hemorrhoid.  Obtain copy of report.

## 2020-05-13 ENCOUNTER — Other Ambulatory Visit: Payer: Self-pay

## 2020-05-13 ENCOUNTER — Ambulatory Visit
Admission: RE | Admit: 2020-05-13 | Discharge: 2020-05-13 | Disposition: A | Discharge status: Home / Self Care | Payer: BC Managed Care – PPO | Source: Ambulatory Visit | Attending: Internal Medicine | Admitting: Internal Medicine

## 2020-05-13 DIAGNOSIS — Z1231 Encounter for screening mammogram for malignant neoplasm of breast: Secondary | ICD-10-CM | POA: Diagnosis present

## 2020-07-17 ENCOUNTER — Ambulatory Visit (INDEPENDENT_AMBULATORY_CARE_PROVIDER_SITE_OTHER): Payer: BC Managed Care – PPO | Admitting: Internal Medicine

## 2020-07-17 ENCOUNTER — Other Ambulatory Visit: Payer: Self-pay

## 2020-07-17 ENCOUNTER — Encounter: Payer: Self-pay | Admitting: Internal Medicine

## 2020-07-17 VITALS — BP 122/80 | HR 68 | Temp 97.9°F | Ht 64.0 in | Wt 138.2 lb

## 2020-07-17 DIAGNOSIS — F439 Reaction to severe stress, unspecified: Secondary | ICD-10-CM

## 2020-07-17 DIAGNOSIS — I10 Essential (primary) hypertension: Secondary | ICD-10-CM

## 2020-07-17 DIAGNOSIS — R1900 Intra-abdominal and pelvic swelling, mass and lump, unspecified site: Secondary | ICD-10-CM

## 2020-07-17 DIAGNOSIS — E7800 Pure hypercholesterolemia, unspecified: Secondary | ICD-10-CM | POA: Diagnosis not present

## 2020-07-17 DIAGNOSIS — Z23 Encounter for immunization: Secondary | ICD-10-CM | POA: Diagnosis not present

## 2020-07-17 LAB — BASIC METABOLIC PANEL
BUN: 14 mg/dL (ref 6–23)
CO2: 32 mEq/L (ref 19–32)
Calcium: 9.5 mg/dL (ref 8.4–10.5)
Chloride: 100 mEq/L (ref 96–112)
Creatinine, Ser: 0.73 mg/dL (ref 0.40–1.20)
GFR: 88.26 mL/min (ref >60.00)
Glucose, Bld: 86 mg/dL (ref 70–99)
Potassium: 4.4 mEq/L (ref 3.5–5.1)
Sodium: 139 mEq/L (ref 135–145)

## 2020-07-17 LAB — HEPATIC FUNCTION PANEL
ALT: 20 U/L (ref 0–35)
AST: 21 U/L (ref 0–37)
Albumin: 4.5 g/dL (ref 3.5–5.2)
Alkaline Phosphatase: 75 U/L (ref 39–117)
Bilirubin, Direct: 0.2 mg/dL (ref 0.0–0.3)
Total Bilirubin: 1.1 mg/dL (ref 0.2–1.2)
Total Protein: 6.8 g/dL (ref 6.0–8.3)

## 2020-07-17 LAB — LIPID PANEL
Cholesterol: 189 mg/dL (ref 0–200)
HDL: 65.8 mg/dL (ref >39.00)
LDL Cholesterol: 101 mg/dL — ABNORMAL HIGH (ref 0–99)
NonHDL: 123.61
Total CHOL/HDL Ratio: 3
Triglycerides: 113 mg/dL (ref 0.0–149.0)
VLDL: 22.6 mg/dL (ref 0.0–40.0)

## 2020-07-17 NOTE — Progress Notes (Signed)
Patient ID: Melinda Barber, female   DOB: Oct 14, 1955, 64 y.o.   MRN: [id number]   Subjective:    Patient ID: Melinda Barber, female    DOB: Sep 22, 1955, 64 y.o.   MRN: [id number]  HPI This visit occurred during the SARS-CoV-2 public health emergency.  Safety protocols were in place, including screening questions prior to the visit, additional usage of staff PPE, and extensive cleaning of exam room while observing appropriate contact time as indicated for disinfecting solutions.  Patient here for a scheduled follow up. Here to f/u regarding her blood pressure and cholesterol. She is doing relatively well.  Staying active.  Increased stress with her mother's health issues.  Overall she feels she is handling things well.  No chest pain or sob reported.  No abdominal pain or bowel change reported.  Had covid vaccines.  Getting flu vaccine today.  Some right low back pain - intermittent - only notices after driving her husband's car.  Seat is different in that car.  Will notice some right lower abdominal discomfort when this occurs.  Still has the "bulge".  No other abdominal pain, nausea or vomiting.     Past Medical History:  Diagnosis Date  . Hypercholesterolemia   . Hypertension    History reviewed. No pertinent surgical history. Family History  Problem Relation Age of Onset  . Breast cancer Other 47  . Hypertension Mother   . Hypercholesterolemia Mother   . Diabetes Mother   . Transient ischemic attack Mother   . Aneurysm Father   . Hypertension Brother   . Aneurysm Brother        heart  . Colon cancer Neg Hx    Social History   Socioeconomic History  . Marital status: Married    Spouse name: Not on file  . Number of children: 3  . Years of education: Not on file  . Highest education level: Not on file  Occupational History  . Not on file  Tobacco Use  . Smoking status: Former Smoker    Quit date: 09/20/1991    Years since quitting: 28.8  . Smokeless tobacco: Never Used    Substance and Sexual Activity  . Alcohol use: Yes    Alcohol/week: 0.0 standard drinks  . Drug use: No  . Sexual activity: Not on file  Other Topics Concern  . Not on file  Social History Narrative  . Not on file   Social Determinants of Health   Financial Resource Strain:   . Difficulty of Paying Living Expenses: Not on file  Food Insecurity:   . Worried About Programme researcher, broadcasting/film/video in the Last Year: Not on file  . Ran Out of Food in the Last Year: Not on file  Transportation Needs:   . Lack of Transportation (Medical): Not on file  . Lack of Transportation (Non-Medical): Not on file  Physical Activity:   . Days of Exercise per Week: Not on file  . Minutes of Exercise per Session: Not on file  Stress:   . Feeling of Stress : Not on file  Social Connections:   . Frequency of Communication with Friends and Family: Not on file  . Frequency of Social Gatherings with Friends and Family: Not on file  . Attends Religious Services: Not on file  . Active Member of Clubs or Organizations: Not on file  . Attends Banker Meetings: Not on file  . Marital Status: Not on file    Outpatient Encounter Medications as of 07/17/2020  Medication Sig  . amLODipine (NORVASC) 10 MG tablet TAKE 1 TABLET BY MOUTH EVERY DAY  . aspirin EC 81 MG tablet Take 81 mg by mouth daily.  . clotrimazole-betamethasone (LOTRISONE) cream Apply 1 application topically 2 (two) times daily as needed.  Marland Kitchen losartan-hydrochlorothiazide (HYZAAR) 100-25 MG tablet TAKE 1 TABLET BY MOUTH DAILY  . metoprolol succinate (TOPROL-XL) 25 MG 24 hr tablet TAKE 1 TABLET BY MOUTH EVERY DAY  . Multiple Vitamin (MULTIVITAMIN) capsule Take 1 capsule by mouth daily.   No facility-administered encounter medications on file as of 07/17/2020.    Review of Systems  Constitutional: Negative for appetite change and unexpected weight change.  HENT: Negative for congestion and sinus pressure.   Respiratory: Negative for cough,  chest tightness and shortness of breath.   Cardiovascular: Negative for chest pain, palpitations and leg swelling.  Gastrointestinal: Negative for diarrhea, nausea and vomiting.       Right lower abdominal pain as outlined.    Genitourinary: Negative for difficulty urinating and dysuria.  Musculoskeletal: Negative for joint swelling and myalgias.       Right low back pain as outlined.    Skin: Negative for color change and rash.  Neurological: Negative for dizziness, light-headedness and headaches.  Psychiatric/Behavioral: Negative for agitation and dysphoric mood.       Objective:    Physical Exam Vitals reviewed.  Constitutional:      General: She is not in acute distress.    Appearance: Normal appearance.  HENT:     Head: Normocephalic and atraumatic.     Right Ear: External ear normal.     Left Ear: External ear normal.  Eyes:     General: No scleral icterus.       Right eye: No discharge.        Left eye: No discharge.     Conjunctiva/sclera: Conjunctivae normal.  Neck:     Thyroid: No thyromegaly.  Cardiovascular:     Rate and Rhythm: Normal rate and regular rhythm.  Pulmonary:     Effort: No respiratory distress.     Breath sounds: Normal breath sounds. No wheezing.  Abdominal:     General: Bowel sounds are normal.     Palpations: Abdomen is soft.     Tenderness: There is no abdominal tenderness.     Comments: No significant pain to palpation.   Musculoskeletal:        General: No swelling or tenderness.     Cervical back: Neck supple. No tenderness.  Lymphadenopathy:     Cervical: No cervical adenopathy.  Skin:    Findings: No erythema or rash.  Neurological:     Mental Status: She is alert.  Psychiatric:        Mood and Affect: Mood normal.        Behavior: Behavior normal.     BP 122/80   Pulse 68   Temp 97.9 F (36.6 C) (Oral)   Ht 5\' 4"  (1.626 m)   Wt 138 lb 3.2 oz (62.7 kg)   LMP 06/28/2006   SpO2 97%   BMI 23.72 kg/m  Wt Readings from  Last 3 Encounters:  07/17/20 138 lb 3.2 oz (62.7 kg)  03/16/20 136 lb (61.7 kg)  03/15/19 135 lb (61.2 kg)     Lab Results  Component Value Date   WBC 4.6 03/16/2020   HGB 14.6 03/16/2020   HCT 43.3 03/16/2020   PLT 214.0 03/16/2020   GLUCOSE 86 07/17/2020   CHOL 189 07/17/2020  TRIG 113.0 07/17/2020   HDL 65.80 07/17/2020   LDLDIRECT 112.0 06/14/2018   LDLCALC 101 (H) 07/17/2020   ALT 20 07/17/2020   AST 21 07/17/2020   NA 139 07/17/2020   K 4.4 07/17/2020   CL 100 07/17/2020   CREATININE 0.73 07/17/2020   BUN 14 07/17/2020   CO2 32 07/17/2020   TSH 1.59 10/14/2019   HGBA1C 5.3 03/27/2019    MM 3D SCREEN BREAST BILATERAL  Result Date: 05/14/2020 CLINICAL DATA:  Screening. EXAM: DIGITAL SCREENING BILATERAL MAMMOGRAM WITH TOMO AND CAD COMPARISON:  Previous exam(s). ACR Breast Density Category b: There are scattered areas of fibroglandular density. FINDINGS: There are no findings suspicious for malignancy. Images were processed with CAD. IMPRESSION: No mammographic evidence of malignancy. A result letter of this screening mammogram will be mailed directly to the patient. RECOMMENDATION: Screening mammogram in one year. (Code:SM-B-01Y) BI-RADS CATEGORY  1: Negative. Electronically Signed   By: Frederico Hamman M.D.   On: 05/14/2020 08:50       Assessment & Plan:   Problem List Items Addressed This Visit    Stress    Overall appears to be handling things well.  Follow.       Hypertension    Blood pressure as outlined.  On losartan/hctz.  Follow pressures.  Follow metabolic panel.       Hypercholesterolemia - Primary    Low cholesterol diet and exercise.  Follow lipid panel.        Relevant Orders   Hepatic function panel (Completed)   Lipid panel (Completed)   Basic metabolic panel (Completed)   Hyperbilirubinemia    Bilirubin slightly elevated on recent lab check.  Remainder of liver panel wnl.  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  Recheck liver panel next labs.        Abdominal wall bulge    Question of hernia.  Only notices pain after driving her husband's car. Discussed further evaluation.  Notify me if desires further w/up/evaluation.         Other Visit Diagnoses    Needs flu shot       Relevant Orders   Flu Vaccine QUAD 6+ mos PF IM (Fluarix Quad PF) (Completed)       Dale , MD

## 2020-07-26 ENCOUNTER — Encounter: Payer: Self-pay | Admitting: Internal Medicine

## 2020-07-26 NOTE — Assessment & Plan Note (Signed)
Low cholesterol diet and exercise.  Follow lipid panel.   

## 2020-07-26 NOTE — Assessment & Plan Note (Signed)
Bilirubin slightly elevated on recent lab check.  Remainder of liver panel wnl.  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  Recheck liver panel next labs.

## 2020-07-26 NOTE — Assessment & Plan Note (Signed)
Question of hernia.  Only notices pain after driving her husband's car. Discussed further evaluation.  Notify me if desires further w/up/evaluation.

## 2020-07-26 NOTE — Assessment & Plan Note (Signed)
Blood pressure as outlined.  On losartan/hctz.  Follow pressures.  Follow metabolic panel.

## 2020-07-26 NOTE — Assessment & Plan Note (Signed)
Overall appears to be handling things well.  Follow.  ?

## 2020-09-23 ENCOUNTER — Other Ambulatory Visit: Payer: Self-pay | Admitting: Internal Medicine

## 2020-11-17 ENCOUNTER — Ambulatory Visit: Payer: BC Managed Care – PPO | Admitting: Internal Medicine

## 2021-01-01 ENCOUNTER — Other Ambulatory Visit: Payer: Self-pay | Admitting: Internal Medicine

## 2021-03-15 ENCOUNTER — Telehealth: Payer: Self-pay | Admitting: *Deleted

## 2021-03-15 DIAGNOSIS — D72819 Decreased white blood cell count, unspecified: Secondary | ICD-10-CM

## 2021-03-15 DIAGNOSIS — I10 Essential (primary) hypertension: Secondary | ICD-10-CM

## 2021-03-15 DIAGNOSIS — E7800 Pure hypercholesterolemia, unspecified: Secondary | ICD-10-CM

## 2021-03-15 NOTE — Telephone Encounter (Signed)
Please place future orders for lab appt.  

## 2021-03-15 NOTE — Telephone Encounter (Signed)
Orders placed for future labs.  

## 2021-03-16 ENCOUNTER — Other Ambulatory Visit: Payer: Self-pay

## 2021-03-16 ENCOUNTER — Other Ambulatory Visit (INDEPENDENT_AMBULATORY_CARE_PROVIDER_SITE_OTHER): Payer: BC Managed Care – PPO

## 2021-03-16 DIAGNOSIS — E7800 Pure hypercholesterolemia, unspecified: Secondary | ICD-10-CM | POA: Diagnosis not present

## 2021-03-16 DIAGNOSIS — I10 Essential (primary) hypertension: Secondary | ICD-10-CM

## 2021-03-16 LAB — BASIC METABOLIC PANEL
BUN: 12 mg/dL (ref 6–23)
CO2: 32 mEq/L (ref 19–32)
Calcium: 9.6 mg/dL (ref 8.4–10.5)
Chloride: 100 mEq/L (ref 96–112)
Creatinine, Ser: 0.68 mg/dL (ref 0.40–1.20)
GFR: 93.03 mL/min (ref >60.00)
Glucose, Bld: 94 mg/dL (ref 70–99)
Potassium: 4.1 mEq/L (ref 3.5–5.1)
Sodium: 138 mEq/L (ref 135–145)

## 2021-03-16 LAB — LIPID PANEL
Cholesterol: 189 mg/dL (ref 0–200)
HDL: 64.1 mg/dL (ref >39.00)
LDL Cholesterol: 109 mg/dL — ABNORMAL HIGH (ref 0–99)
NonHDL: 125.13
Total CHOL/HDL Ratio: 3
Triglycerides: 83 mg/dL (ref 0.0–149.0)
VLDL: 16.6 mg/dL (ref 0.0–40.0)

## 2021-03-16 LAB — CBC WITH DIFFERENTIAL/PLATELET
Basophils Absolute: 0 10*3/uL (ref 0.0–0.1)
Basophils Relative: 0.9 % (ref 0.0–3.0)
Eosinophils Absolute: 0.1 10*3/uL (ref 0.0–0.7)
Eosinophils Relative: 2.8 % (ref 0.0–5.0)
HCT: 42.8 % (ref 36.0–46.0)
Hemoglobin: 14.5 g/dL (ref 12.0–15.0)
Lymphocytes Relative: 41 % (ref 12.0–46.0)
Lymphs Abs: 1.9 10*3/uL (ref 0.7–4.0)
MCHC: 33.9 g/dL (ref 30.0–36.0)
MCV: 93.3 fl (ref 78.0–100.0)
Monocytes Absolute: 0.4 10*3/uL (ref 0.1–1.0)
Monocytes Relative: 9.3 % (ref 3.0–12.0)
Neutro Abs: 2.1 10*3/uL (ref 1.4–7.7)
Neutrophils Relative %: 46 % (ref 43.0–77.0)
Platelets: 221 10*3/uL (ref 150.0–400.0)
RBC: 4.59 Mil/uL (ref 3.87–5.11)
RDW: 13.4 % (ref 11.5–15.5)
WBC: 4.6 10*3/uL (ref 4.0–10.5)

## 2021-03-16 LAB — HEPATIC FUNCTION PANEL
ALT: 23 U/L (ref 0–35)
AST: 26 U/L (ref 0–37)
Albumin: 4.5 g/dL (ref 3.5–5.2)
Alkaline Phosphatase: 89 U/L (ref 39–117)
Bilirubin, Direct: 0.2 mg/dL (ref 0.0–0.3)
Total Bilirubin: 1.1 mg/dL (ref 0.2–1.2)
Total Protein: 7.2 g/dL (ref 6.0–8.3)

## 2021-03-16 LAB — TSH: TSH: 1.75 u[IU]/mL (ref 0.35–4.50)

## 2021-03-18 ENCOUNTER — Other Ambulatory Visit (HOSPITAL_COMMUNITY)
Admission: RE | Admit: 2021-03-18 | Discharge: 2021-03-18 | Disposition: A | Discharge status: Home / Self Care | Payer: BC Managed Care – PPO | Source: Ambulatory Visit | Attending: Internal Medicine | Admitting: Internal Medicine

## 2021-03-18 ENCOUNTER — Other Ambulatory Visit: Payer: Self-pay

## 2021-03-18 ENCOUNTER — Ambulatory Visit (INDEPENDENT_AMBULATORY_CARE_PROVIDER_SITE_OTHER): Payer: BC Managed Care – PPO | Admitting: Internal Medicine

## 2021-03-18 ENCOUNTER — Encounter: Payer: Self-pay | Admitting: Internal Medicine

## 2021-03-18 VITALS — BP 124/82 | HR 78 | Temp 97.8°F | Ht 64.02 in | Wt 133.4 lb

## 2021-03-18 DIAGNOSIS — Z124 Encounter for screening for malignant neoplasm of cervix: Secondary | ICD-10-CM | POA: Insufficient documentation

## 2021-03-18 DIAGNOSIS — I10 Essential (primary) hypertension: Secondary | ICD-10-CM

## 2021-03-18 DIAGNOSIS — Z0000 Encounter for general adult medical examination without abnormal findings: Secondary | ICD-10-CM | POA: Diagnosis not present

## 2021-03-18 DIAGNOSIS — E7800 Pure hypercholesterolemia, unspecified: Secondary | ICD-10-CM | POA: Diagnosis not present

## 2021-03-18 DIAGNOSIS — F439 Reaction to severe stress, unspecified: Secondary | ICD-10-CM

## 2021-03-18 DIAGNOSIS — D72819 Decreased white blood cell count, unspecified: Secondary | ICD-10-CM

## 2021-03-18 DIAGNOSIS — D582 Other hemoglobinopathies: Secondary | ICD-10-CM

## 2021-03-18 MED ORDER — ESCITALOPRAM OXALATE 10 MG PO TABS: 10.0000 mg | ORAL_TABLET | Freq: Every day | ORAL | 2 refills | Status: DC

## 2021-03-18 NOTE — Assessment & Plan Note (Signed)
Physical today 03/18/21.  PAP 03/18/21.  Mammogram 05/14/20- Birads I.  Colonoscopy 06/10/19 - diverticulosis and internal hemorhoids.

## 2021-03-18 NOTE — Progress Notes (Signed)
Patient ID: Melinda Barber, female   DOB: [birth year], 65 y.o.   MRN: [id number]   Subjective:    Patient ID: Melinda Barber, female    DOB: 06-01-1956, 65 y.o.   MRN: [id number]  HPI This visit occurred during the SARS-CoV-2 public health emergency.  Safety protocols were in place, including screening questions prior to the visit, additional usage of staff PPE, and extensive cleaning of exam room while observing appropriate contact time as indicated for disinfecting solutions.   Patient here for her physical exam.  Increased stress with her mother's health issues.  Discussed.  She feels she needs something to help level things out.  Discussed getting help - such as palliative care, etc.  She stays active.  No chest pain or sob reported.  No acid reflux.  No abdominal pain.  Bowels moving.    Past Medical History:  Diagnosis Date   Hypercholesterolemia    Hypertension    History reviewed. No pertinent surgical history. Family History  Problem Relation Age of Onset   Breast cancer Other 38   Hypertension Mother    Hypercholesterolemia Mother    Diabetes Mother    Transient ischemic attack Mother    Aneurysm Father    Hypertension Brother    Aneurysm Brother        heart   Colon cancer Neg Hx    Social History   Socioeconomic History   Marital status: Married    Spouse name: Not on file   Number of children: 3   Years of education: Not on file   Highest education level: Not on file  Occupational History   Not on file  Tobacco Use   Smoking status: Former    Pack years: 0.00    Types: Cigarettes    Quit date: 09/20/1991    Years since quitting: 29.5   Smokeless tobacco: Never  Substance and Sexual Activity   Alcohol use: Yes    Alcohol/week: 0.0 standard drinks   Drug use: No   Sexual activity: Not on file  Other Topics Concern   Not on file  Social History Narrative   Not on file   Social Determinants of Health   Financial Resource Strain: Not on file  Food  Insecurity: Not on file  Transportation Needs: Not on file  Physical Activity: Not on file  Stress: Not on file  Social Connections: Not on file    Review of Systems  Constitutional:  Negative for appetite change and unexpected weight change.  HENT:  Negative for congestion, sinus pressure and sore throat.   Eyes:  Negative for pain and visual disturbance.  Respiratory:  Negative for cough, chest tightness and shortness of breath.   Cardiovascular:  Negative for chest pain and palpitations.  Gastrointestinal:  Negative for abdominal pain, diarrhea, nausea and vomiting.  Genitourinary:  Negative for difficulty urinating and frequency.  Musculoskeletal:  Negative for joint swelling and myalgias.  Skin:  Negative for color change and rash.  Neurological:  Negative for dizziness, light-headedness and headaches.  Hematological:  Negative for adenopathy. Does not bruise/bleed easily.  Psychiatric/Behavioral:  Negative for agitation and dysphoric mood.        Increased stress as outlined.        Objective:    Physical Exam Vitals reviewed.  Constitutional:      General: She is not in acute distress.    Appearance: Normal appearance. She is well-developed.  HENT:     Head: Normocephalic and atraumatic.  Right Ear: External ear normal.     Left Ear: External ear normal.  Eyes:     General: No scleral icterus.       Right eye: No discharge.        Left eye: No discharge.     Conjunctiva/sclera: Conjunctivae normal.  Neck:     Thyroid: No thyromegaly.  Cardiovascular:     Rate and Rhythm: Normal rate and regular rhythm.  Pulmonary:     Effort: No tachypnea, accessory muscle usage or respiratory distress.     Breath sounds: Normal breath sounds. No decreased breath sounds or wheezing.  Chest:  Breasts:    Right: No inverted nipple, mass, nipple discharge or tenderness (no axillary adenopathy).     Left: No inverted nipple, mass, nipple discharge or tenderness (no axilarry  adenopathy).  Abdominal:     General: Bowel sounds are normal.     Palpations: Abdomen is soft.     Tenderness: There is no abdominal tenderness.  Musculoskeletal:        General: No swelling or tenderness.     Cervical back: Neck supple.  Lymphadenopathy:     Cervical: No cervical adenopathy.  Skin:    Findings: No erythema or rash.  Neurological:     Mental Status: She is alert and oriented to person, place, and time.  Psychiatric:        Mood and Affect: Mood normal.        Behavior: Behavior normal.    BP 124/82   Pulse 78   Temp 97.8 F (36.6 C)   Ht 5' 4.02" (1.626 m)   Wt 133 lb 6.4 oz (60.5 kg)   LMP 06/28/2006   SpO2 98%   BMI 22.89 kg/m  Wt Readings from Last 3 Encounters:  03/18/21 133 lb 6.4 oz (60.5 kg)  07/17/20 138 lb 3.2 oz (62.7 kg)  03/16/20 136 lb (61.7 kg)    Outpatient Encounter Medications as of 03/18/2021  Medication Sig   amLODipine (NORVASC) 10 MG tablet TAKE 1 TABLET BY MOUTH EVERY DAY   aspirin EC 81 MG tablet Take 81 mg by mouth daily.   clotrimazole-betamethasone (LOTRISONE) cream Apply 1 application topically 2 (two) times daily as needed.   escitalopram (LEXAPRO) 10 MG tablet Take 1 tablet (10 mg total) by mouth daily.   losartan-hydrochlorothiazide (HYZAAR) 100-25 MG tablet TAKE 1 TABLET BY MOUTH DAILY   metoprolol succinate (TOPROL-XL) 25 MG 24 hr tablet TAKE 1 TABLET BY MOUTH EVERY DAY   Multiple Vitamin (MULTIVITAMIN) capsule Take 1 capsule by mouth daily.   No facility-administered encounter medications on file as of 03/18/2021.     Lab Results  Component Value Date   WBC 4.6 03/16/2021   HGB 14.5 03/16/2021   HCT 42.8 03/16/2021   PLT 221.0 03/16/2021   GLUCOSE 94 03/16/2021   CHOL 189 03/16/2021   TRIG 83.0 03/16/2021   HDL 64.10 03/16/2021   LDLDIRECT 112.0 06/14/2018   LDLCALC 109 (H) 03/16/2021   ALT 23 03/16/2021   AST 26 03/16/2021   NA 138 03/16/2021   K 4.1 03/16/2021   CL 100 03/16/2021   CREATININE 0.68  03/16/2021   BUN 12 03/16/2021   CO2 32 03/16/2021   TSH 1.75 03/16/2021   HGBA1C 5.3 03/27/2019    MM 3D SCREEN BREAST BILATERAL  Result Date: 05/14/2020 CLINICAL DATA:  Screening. EXAM: DIGITAL SCREENING BILATERAL MAMMOGRAM WITH TOMO AND CAD COMPARISON:  Previous exam(s). ACR Breast Density Category b: There are scattered  areas of fibroglandular density. FINDINGS: There are no findings suspicious for malignancy. Images were processed with CAD. IMPRESSION: No mammographic evidence of malignancy. A result letter of this screening mammogram will be mailed directly to the patient. RECOMMENDATION: Screening mammogram in one year. (Code:SM-B-01Y) BI-RADS CATEGORY  1: Negative. Electronically Signed   By: Frederico Hamman M.D.   On: 05/14/2020 08:50       Assessment & Plan:   Problem List Items Addressed This Visit     Elevated hemoglobin (HCC)    Recent check wnl.        Health care maintenance    Physical today 03/18/21.  PAP 03/18/21.  Mammogram 05/14/20- Birads I.  Colonoscopy 06/10/19 - diverticulosis and internal hemorhoids.         Hyperbilirubinemia    Recent liver panel  Wnl.         Hypercholesterolemia    The 10-year ASCVD risk score Denman George DC Montez Hageman., et al., 2013) is: 4.5%   Values used to calculate the score:     Age: 65 years     Sex: Female     Is Non-Hispanic African American: No     Diabetic: No     Tobacco smoker: No     Systolic Blood Pressure: 124 mmHg     Is BP treated: Yes     HDL Cholesterol: 64.1 mg/dL     Total Cholesterol: 189 mg/dL  Low cholesterol diet and exercise.  Follow lipid panel.        Hypertension    Blood pressure as outlined.  On losartan/hctz.  Follow pressures.  Follow metabolic panel.        Leukopenia    Follow cbc.        Stress    Increased stress as outlined.  Discussed.  She feels she needs something to help level things out.  Start lexapro.  Follow.  Get her back in soon to reassess.         Other Visit Diagnoses      Cervical cancer screening    -  Primary   Relevant Orders   Cytology - PAP( [place]) (Completed)        Dale Brogan, MD

## 2021-03-18 NOTE — Assessment & Plan Note (Addendum)
The 10-year ASCVD risk score Denman George DC Montez Hageman., et al., 2013) is: 4.5%   Values used to calculate the score:     Age: 65 years     Sex: Female     Is Non-Hispanic African American: No     Diabetic: No     Tobacco smoker: No     Systolic Blood Pressure: 124 mmHg     Is BP treated: Yes     HDL Cholesterol: 64.1 mg/dL     Total Cholesterol: 189 mg/dL  Low cholesterol diet and exercise.  Follow lipid panel.

## 2021-03-19 LAB — CYTOLOGY - PAP
Comment: NEGATIVE
Diagnosis: NEGATIVE
High risk HPV: NEGATIVE

## 2021-03-23 ENCOUNTER — Encounter: Payer: Self-pay | Admitting: Internal Medicine

## 2021-03-23 NOTE — Assessment & Plan Note (Signed)
Follow cbc.  

## 2021-03-23 NOTE — Assessment & Plan Note (Signed)
Recent liver panel  Wnl.

## 2021-03-23 NOTE — Assessment & Plan Note (Signed)
Blood pressure as outlined.  On losartan/hctz.  Follow pressures.  Follow metabolic panel.

## 2021-03-23 NOTE — Assessment & Plan Note (Signed)
Increased stress as outlined.  Discussed.  She feels she needs something to help level things out.  Start lexapro.  Follow.  Get her back in soon to reassess.

## 2021-03-23 NOTE — Assessment & Plan Note (Signed)
Recent check wnl.   

## 2021-03-31 ENCOUNTER — Other Ambulatory Visit: Payer: Self-pay | Admitting: Internal Medicine

## 2021-03-31 DIAGNOSIS — Z1231 Encounter for screening mammogram for malignant neoplasm of breast: Secondary | ICD-10-CM

## 2021-04-07 ENCOUNTER — Other Ambulatory Visit: Payer: Self-pay | Admitting: Internal Medicine

## 2021-05-17 ENCOUNTER — Other Ambulatory Visit: Payer: Self-pay

## 2021-05-17 ENCOUNTER — Ambulatory Visit
Admission: RE | Admit: 2021-05-17 | Discharge: 2021-05-17 | Disposition: A | Discharge status: Home / Self Care | Payer: BC Managed Care – PPO | Source: Ambulatory Visit | Attending: Internal Medicine | Admitting: Internal Medicine

## 2021-05-17 DIAGNOSIS — Z1231 Encounter for screening mammogram for malignant neoplasm of breast: Secondary | ICD-10-CM | POA: Diagnosis present

## 2021-06-19 ENCOUNTER — Other Ambulatory Visit: Payer: Self-pay | Admitting: Internal Medicine

## 2021-06-23 ENCOUNTER — Ambulatory Visit: Payer: BC Managed Care – PPO | Admitting: Internal Medicine

## 2021-07-09 ENCOUNTER — Other Ambulatory Visit: Payer: Self-pay

## 2021-07-09 ENCOUNTER — Ambulatory Visit (INDEPENDENT_AMBULATORY_CARE_PROVIDER_SITE_OTHER): Payer: BC Managed Care – PPO

## 2021-07-09 DIAGNOSIS — Z23 Encounter for immunization: Secondary | ICD-10-CM

## 2021-07-27 ENCOUNTER — Ambulatory Visit: Payer: BC Managed Care – PPO | Admitting: Internal Medicine

## 2021-07-27 ENCOUNTER — Other Ambulatory Visit: Payer: Self-pay

## 2021-07-27 DIAGNOSIS — E7800 Pure hypercholesterolemia, unspecified: Secondary | ICD-10-CM | POA: Diagnosis not present

## 2021-07-27 DIAGNOSIS — D582 Other hemoglobinopathies: Secondary | ICD-10-CM

## 2021-07-27 DIAGNOSIS — F439 Reaction to severe stress, unspecified: Secondary | ICD-10-CM | POA: Diagnosis not present

## 2021-07-27 DIAGNOSIS — I10 Essential (primary) hypertension: Secondary | ICD-10-CM

## 2021-07-27 DIAGNOSIS — R1900 Intra-abdominal and pelvic swelling, mass and lump, unspecified site: Secondary | ICD-10-CM

## 2021-07-27 LAB — HEPATIC FUNCTION PANEL
ALT: 20 U/L (ref 0–35)
AST: 22 U/L (ref 0–37)
Albumin: 4.5 g/dL (ref 3.5–5.2)
Alkaline Phosphatase: 85 U/L (ref 39–117)
Bilirubin, Direct: 0.2 mg/dL (ref 0.0–0.3)
Total Bilirubin: 1.5 mg/dL — ABNORMAL HIGH (ref 0.2–1.2)
Total Protein: 6.9 g/dL (ref 6.0–8.3)

## 2021-07-27 LAB — LIPID PANEL
Cholesterol: 194 mg/dL (ref 0–200)
HDL: 67.8 mg/dL (ref >39.00)
LDL Cholesterol: 105 mg/dL — ABNORMAL HIGH (ref 0–99)
NonHDL: 126.34
Total CHOL/HDL Ratio: 3
Triglycerides: 107 mg/dL (ref 0.0–149.0)
VLDL: 21.4 mg/dL (ref 0.0–40.0)

## 2021-07-27 LAB — BASIC METABOLIC PANEL
BUN: 12 mg/dL (ref 6–23)
CO2: 31 mEq/L (ref 19–32)
Calcium: 9.3 mg/dL (ref 8.4–10.5)
Chloride: 101 mEq/L (ref 96–112)
Creatinine, Ser: 0.61 mg/dL (ref 0.40–1.20)
GFR: 95.25 mL/min (ref >60.00)
Glucose, Bld: 89 mg/dL (ref 70–99)
Potassium: 3.6 mEq/L (ref 3.5–5.1)
Sodium: 140 mEq/L (ref 135–145)

## 2021-07-27 NOTE — Progress Notes (Signed)
Patient ID: Melinda Barber, female   DOB: 1956-09-15, 65 y.o.   MRN: [id number]   Subjective:    Patient ID: Melinda Barber, female    DOB: 1956-01-17, 65 y.o.   MRN: [id number]  This visit occurred during the SARS-CoV-2 public health emergency.  Safety protocols were in place, including screening questions prior to the visit, additional usage of staff PPE, and extensive cleaning of exam room while observing appropriate contact time as indicated for disinfecting solutions.   Patient here for a scheduled follow up.   Chief Complaint  Patient presents with   Hypertension   .   HPI Increased stress.  Discussed.  Overall she feels she is handling things relatively well.  Did not tolerate lexapro.  Not taking.  Is taking melatonin.  This is helping her sleep.  Does not feel needs any further intervention at this time. Tries to stay active.  No chest pain or sob reported. No cough or congestion.  Does report persistent intermittent right lower abdominal pressures.  More discomfort yesterday.  No change with bowel movement.  Had bm yesterday.  No significant problem with constipation.  No urine change.     Past Medical History:  Diagnosis Date   Hypercholesterolemia    Hypertension    History reviewed. No pertinent surgical history. Family History  Problem Relation Age of Onset   Breast cancer Other 51   Hypertension Mother    Hypercholesterolemia Mother    Diabetes Mother    Transient ischemic attack Mother    Aneurysm Father    Hypertension Brother    Aneurysm Brother        heart   Colon cancer Neg Hx    Social History   Socioeconomic History   Marital status: Married    Spouse name: Not on file   Number of children: 3   Years of education: Not on file   Highest education level: Not on file  Occupational History   Not on file  Tobacco Use   Smoking status: Former    Types: Cigarettes    Quit date: 09/20/1991    Years since quitting: 29.8   Smokeless tobacco: Never  Substance  and Sexual Activity   Alcohol use: Yes    Alcohol/week: 0.0 standard drinks   Drug use: No   Sexual activity: Not on file  Other Topics Concern   Not on file  Social History Narrative   Not on file   Social Determinants of Health   Financial Resource Strain: Not on file  Food Insecurity: Not on file  Transportation Needs: Not on file  Physical Activity: Not on file  Stress: Not on file  Social Connections: Not on file     Review of Systems  Constitutional:  Negative for appetite change and unexpected weight change.  HENT:  Negative for congestion and sinus pressure.   Respiratory:  Negative for cough, chest tightness and shortness of breath.   Cardiovascular:  Negative for chest pain, palpitations and leg swelling.  Gastrointestinal:  Negative for diarrhea, nausea and vomiting.       Right lower abdominal pain as outlined.   Genitourinary:  Negative for difficulty urinating and dysuria.  Musculoskeletal:  Negative for joint swelling and myalgias.  Skin:  Negative for color change and rash.  Neurological:  Negative for dizziness, light-headedness and headaches.  Psychiatric/Behavioral:  Negative for agitation and dysphoric mood.       Objective:     BP 126/72   Pulse 67  Temp 97.9 F (36.6 C)   Resp 16   Ht 5\' 4"  (1.626 m)   Wt 137 lb 3.2 oz (62.2 kg)   LMP 06/28/2006   SpO2 98%   BMI 23.55 kg/m  Wt Readings from Last 3 Encounters:  07/27/21 137 lb 3.2 oz (62.2 kg)  03/18/21 133 lb 6.4 oz (60.5 kg)  07/17/20 138 lb 3.2 oz (62.7 kg)    Physical Exam Vitals reviewed.  Constitutional:      General: She is not in acute distress.    Appearance: Normal appearance.  HENT:     Head: Normocephalic and atraumatic.     Right Ear: External ear normal.     Left Ear: External ear normal.  Eyes:     General: No scleral icterus.       Right eye: No discharge.        Left eye: No discharge.     Conjunctiva/sclera: Conjunctivae normal.  Neck:     Thyroid: No  thyromegaly.  Cardiovascular:     Rate and Rhythm: Normal rate and regular rhythm.  Pulmonary:     Effort: No respiratory distress.     Breath sounds: Normal breath sounds. No wheezing.  Abdominal:     General: Bowel sounds are normal.     Palpations: Abdomen is soft.     Comments: Discomfort to palpation - right lower abdomen - increased fullness - appears to be c/w hernia.    Musculoskeletal:        General: No swelling or tenderness.     Cervical back: Neck supple. No tenderness.  Lymphadenopathy:     Cervical: No cervical adenopathy.  Skin:    Findings: No erythema or rash.  Neurological:     Mental Status: She is alert.  Psychiatric:        Mood and Affect: Mood normal.        Behavior: Behavior normal.     Outpatient Encounter Medications as of 07/27/2021  Medication Sig   Cholecalciferol 25 MCG (1000 UT) tablet Take 1,000 Units by mouth daily.   amLODipine (NORVASC) 10 MG tablet TAKE 1 TABLET BY MOUTH EVERY DAY   aspirin EC 81 MG tablet Take 81 mg by mouth daily.   clotrimazole-betamethasone (LOTRISONE) cream Apply 1 application topically 2 (two) times daily as needed.   losartan-hydrochlorothiazide (HYZAAR) 100-25 MG tablet TAKE 1 TABLET BY MOUTH DAILY   metoprolol succinate (TOPROL-XL) 25 MG 24 hr tablet TAKE 1 TABLET BY MOUTH EVERY DAY   Multiple Vitamin (MULTIVITAMIN) capsule Take 1 capsule by mouth daily.   [DISCONTINUED] escitalopram (LEXAPRO) 10 MG tablet Take 1 tablet (10 mg total) by mouth daily.   No facility-administered encounter medications on file as of 07/27/2021.     Lab Results  Component Value Date   WBC 4.6 03/16/2021   HGB 14.5 03/16/2021   HCT 42.8 03/16/2021   PLT 221.0 03/16/2021   GLUCOSE 89 07/27/2021   CHOL 194 07/27/2021   TRIG 107.0 07/27/2021   HDL 67.80 07/27/2021   LDLDIRECT 112.0 06/14/2018   LDLCALC 105 (H) 07/27/2021   ALT 20 07/27/2021   AST 22 07/27/2021   NA 140 07/27/2021   K 3.6 07/27/2021   CL 101 07/27/2021    CREATININE 0.61 07/27/2021   BUN 12 07/27/2021   CO2 31 07/27/2021   TSH 1.75 03/16/2021   HGBA1C 5.3 03/27/2019    MM 3D SCREEN BREAST BILATERAL  Result Date: 05/21/2021 CLINICAL DATA:  Screening. EXAM: DIGITAL SCREENING BILATERAL MAMMOGRAM  WITH TOMOSYNTHESIS AND CAD TECHNIQUE: Bilateral screening digital craniocaudal and mediolateral oblique mammograms were obtained. Bilateral screening digital breast tomosynthesis was performed. The images were evaluated with computer-aided detection. COMPARISON:  Previous exam(s). ACR Breast Density Category b: There are scattered areas of fibroglandular density. FINDINGS: There are no findings suspicious for malignancy. IMPRESSION: No mammographic evidence of malignancy. A result letter of this screening mammogram will be mailed directly to the patient. RECOMMENDATION: Screening mammogram in one year. (Code:SM-B-01Y) BI-RADS CATEGORY  1: Negative. Electronically Signed   By: Sherron Ales M.D.   On: 05/21/2021 08:33      Assessment & Plan:   Problem List Items Addressed This Visit     Abdominal wall bulge    Persistent intermittent discomfort/pressure.  Noticed more yesterday.  No significant pain on exam today.  Exam appears to be c/w hernia.  Discussed further evaluation given increased pressure.  Refer to surgery for further evaluation.       Relevant Orders   Ambulatory referral to General Surgery   Elevated hemoglobin (HCC)    Last hgb wnl.  Follow.       Hyperbilirubinemia    Recheck liver panel.       Hypercholesterolemia    The 10-year ASCVD risk score (Arnett DK, et al., 2019) is: 5.2%   Values used to calculate the score:     Age: 40 years     Sex: Female     Is Non-Hispanic African American: No     Diabetic: No     Tobacco smoker: No     Systolic Blood Pressure: 126 mmHg     Is BP treated: Yes     HDL Cholesterol: 67.8 mg/dL     Total Cholesterol: 194 mg/dL  Low cholesterol diet and exercise.  Follow lipid panel.        Relevant Orders   Basic metabolic panel (Completed)   Hepatic function panel (Completed)   Lipid panel (Completed)   Hypertension    Blood pressure as outlined.  On losartan/hctz.  Follow pressures.  Follow metabolic panel.       Stress    Overall appears to be handling things relatively well.  Not taking lexapro - did not tolerate.  Taking melatonin to help with sleep.  Follow.  Does not feel needs any further intervention at this time.          Dale Ihlen, MD

## 2021-07-28 ENCOUNTER — Encounter: Payer: Self-pay | Admitting: Internal Medicine

## 2021-07-28 NOTE — Assessment & Plan Note (Signed)
Overall appears to be handling things relatively well.  Not taking lexapro - did not tolerate.  Taking melatonin to help with sleep.  Follow.  Does not feel needs any further intervention at this time.

## 2021-07-28 NOTE — Assessment & Plan Note (Signed)
The 10-year ASCVD risk score (Arnett DK, et al., 2019) is: 5.2%   Values used to calculate the score:     Age: 65 years     Sex: Female     Is Non-Hispanic African American: No     Diabetic: No     Tobacco smoker: No     Systolic Blood Pressure: 126 mmHg     Is BP treated: Yes     HDL Cholesterol: 67.8 mg/dL     Total Cholesterol: 194 mg/dL  Low cholesterol diet and exercise.  Follow lipid panel.

## 2021-07-28 NOTE — Assessment & Plan Note (Signed)
Persistent intermittent discomfort/pressure.  Noticed more yesterday.  No significant pain on exam today.  Exam appears to be c/w hernia.  Discussed further evaluation given increased pressure.  Refer to surgery for further evaluation.

## 2021-07-28 NOTE — Assessment & Plan Note (Signed)
Blood pressure as outlined.  On losartan/hctz.  Follow pressures.  Follow metabolic panel.

## 2021-07-28 NOTE — Assessment & Plan Note (Signed)
Recheck liver panel.  

## 2021-07-28 NOTE — Assessment & Plan Note (Signed)
Last hgb wnl.  Follow.   

## 2021-08-02 DIAGNOSIS — Z85828 Personal history of other malignant neoplasm of skin: Secondary | ICD-10-CM | POA: Insufficient documentation

## 2021-08-20 ENCOUNTER — Other Ambulatory Visit: Payer: Self-pay | Admitting: General Surgery

## 2021-08-20 NOTE — Progress Notes (Signed)
Subjective:     Patient ID: Melinda Barber is a 65 y.o. female.   HPI   The following portions of the patient's history were reviewed and updated as appropriate.   This a new patient is here today for: office visit. The patient is here for evaluation of an abdominal wall bulge referred by Dr Dale [place].   Review of Systems  Constitutional: Negative for chills and fever.  Respiratory: Negative for cough.          Chief Complaint  Patient presents with   Hernia      Ht 157.5 cm (5\' 2" )   BMI 24.87 kg/m        Past Medical History:  Diagnosis Date   Colon polyp 06/26/2014    tubular adenoma   Hypercholesterolemia     Hypertension             Past Surgical History:  Procedure Laterality Date   COLONOSCOPY   06/26/2014    Dr. 08/26/2014 @ [facility] - Adenomatous Polyp (42mm), rpt 3 yrs per [facility]   COLONOSCOPY   06/10/2019    Diverticulosis/PHx CP/Repeat 31yrs/[facility]        OB History   No obstetric history on file.     Obstetric Comments  Age at first period Age of first pregnancy             Social History           Socioeconomic History   Marital status: Married  Tobacco Use   Smoking status: Former      Types: Cigarettes      Quit date: 09/19/1988      Years since quitting: 32.8   Smokeless tobacco: Never  Substance and Sexual Activity   Alcohol use: Yes      Comment: socially   Drug use: No   Sexual activity: Defer        No Known Allergies   Current Medications        Current Outpatient Medications  Medication Sig Dispense Refill   amLODIPine (NORVASC) 5 MG tablet Take 5 mg by mouth once daily.       ascorbic acid, vitamin C, (VITAMIN C) 500 MG tablet Take 500 mg by mouth once daily       aspirin 81 MG EC tablet Take 81 mg by mouth once daily       losartan-hydrochlorothiazide (HYZAAR) 100-25 mg tablet Take 1 tablet by mouth once daily.       metoprolol succinate (TOPROL-XL) 25 MG XL tablet Take 25 mg by mouth once daily.        multivitamin capsule Take 1 capsule by mouth once daily.       peg-electrolyte (NULYTELY) solution Take 4,000 mLs by mouth as directed Colon Prep. 4000 mL 0    No current facility-administered medications for this visit.             Family History  Problem Relation Age of Onset   Diabetes Mother     High blood pressure (Hypertension) Mother     Hyperlipidemia (Elevated cholesterol) Mother     Transient ischemic attack Mother     Heart disease Father     Aneurysm Father     No Known Problems Sister     High blood pressure (Hypertension) Brother     Aneurysm Brother     Colon cancer Neg Hx     Colon polyps Neg Hx     Liver disease Neg Hx  Rectal cancer Neg Hx     Ulcers Neg Hx          Labs and Radiology:    Laboratory review:   July 27, 2021:   Total Bilirubin 0.2 - 1.2 mg/dL 1.5 High    Bilirubin, Direct 0.0 - 0.3 mg/dL 0.2   Alkaline Phosphatase 39 - 117 U/L 85   AST 0 - 37 U/L 22   ALT 0 - 35 U/L 20   Total Protein 6.0 - 8.3 g/dL 6.9   Albumin 3.5 - 5.2 g/dL 4.5   Sodium            135 - 145 mEq/L         140  Potassium       3.5 - 5.1 mEq/L           3.6  Chloride           96 - 112 mEq/L           101  CO2     19 - 32 mEq/L 31  Glucose, Bld    70 - 99 mg/dL  89  BUN     6 - 23 mg/dL    12  Creatinine, Ser            0.40 - 1.20 mg/dL        7.25  GFR     >36.64 mL/min            95.25  Comment: Calculated using the CKD-EPI Creatinine Equation (2021) Calcium           8.4 - 10.5 mg/dL          9.3    April 15, 2021:   Component     Ref Range & Units      4 mo ago  WBC    4.0 - 10.5 K/uL            4.6  RBC     3.87 - 5.11 Mil/uL        4.59  Hemoglobin     12.0 - 15.0 g/dL           40.3  HCT     36.0 - 46.0 %   42.8  MCV    78.0 - 100.0 fl  93.3  MCHC 30.0 - 36.0 g/dL           47.4  RDW    11.5 - 15.5 %   13.4  Platelets          150.0 - 400.0 K/uL      221.0  Neutrophils Relative %           43.0 - 77.0 %   46.0  Lymphocytes Relative 12.0 -  46.0 %   41.0  Monocytes Relative    3.0 - 12.0 %     9.3  Eosinophils Relative   0.0 - 5.0 %       2.8  Basophils Relative      0.0 - 3.0 %       0.9  Neutro Abs      1.4 - 7.7 K/uL  2.1  Lymphs Abs    0.7 - 4.0 K/uL  1.9  Monocytes Absolute   0.1 - 1.0 K/uL  0.4  Eosinophils Absolute  0.0 - 0.7 K/uL  0.1  Basophils Absolute     0.0 - 0.1 K/uL  0.0    Colonoscopy June 10, 2019:   Diverticulosis,  small internal hemorrhoids.  No polyps.        Objective:   Physical Exam Exam conducted with a chaperone present.  Constitutional:      Appearance: Normal appearance.  Cardiovascular:     Rate and Rhythm: Normal rate and regular rhythm.     Pulses: Normal pulses.     Heart sounds: Normal heart sounds.  Pulmonary:     Effort: Pulmonary effort is normal.     Breath sounds: Normal breath sounds.  Abdominal:     General: Abdomen is flat. Bowel sounds are normal.     Palpations: Abdomen is soft.     Tenderness: There is no abdominal tenderness.     Hernia: A hernia is present. Hernia is present in the right inguinal area (Reducible). There is no hernia in the left inguinal area.    Musculoskeletal:     Cervical back: Neck supple.  Skin:    General: Skin is warm and dry.  Neurological:     Mental Status: She is alert and oriented to person, place, and time.  Psychiatric:        Mood and Affect: Mood normal.        Behavior: Behavior normal.           Assessment:     Asymptomatic, slowly enlarging right inguinal hernia.    Plan:     Indications for elective repair were reviewed.  Opportunity for referral for robotic/laparoscopic repair discussed.  Role of prosthetic mesh reviewed.   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  She will benefit from elective repair, but will need to make accommodations so that she can recover appropriately.   She asked to be contacted after the first of the year for scheduling.      This note is  partially prepared by Dorathy Daft, RN, acting as a scribe in the presence of Dr. Donnalee Curry, MD.  The documentation recorded by the scribe accurately reflects the service I personally performed and the decisions made by me.    Earline Mayotte, MD FACS

## 2021-11-05 ENCOUNTER — Inpatient Hospital Stay: Admission: RE | Admit: 2021-11-05 | Payer: BC Managed Care – PPO | Source: Ambulatory Visit

## 2021-11-22 ENCOUNTER — Ambulatory Visit: Admit: 2021-11-22 | Payer: BC Managed Care – PPO | Admitting: General Surgery

## 2021-11-22 SURGERY — REPAIR, HERNIA, INGUINAL, ADULT
Anesthesia: General | Laterality: Right

## 2021-11-26 ENCOUNTER — Ambulatory Visit: Payer: BC Managed Care – PPO | Admitting: Internal Medicine

## 2021-11-29 IMAGING — MG DIGITAL SCREENING BILAT W/ TOMO W/ CAD
8 series · 9 of 24 positions shown · non-contrast
Comparison: Previous exam(s).

CLINICAL DATA: Screening.

EXAM:
DIGITAL SCREENING BILATERAL MAMMOGRAM WITH TOMO AND CAD

[R CC synth-2D]
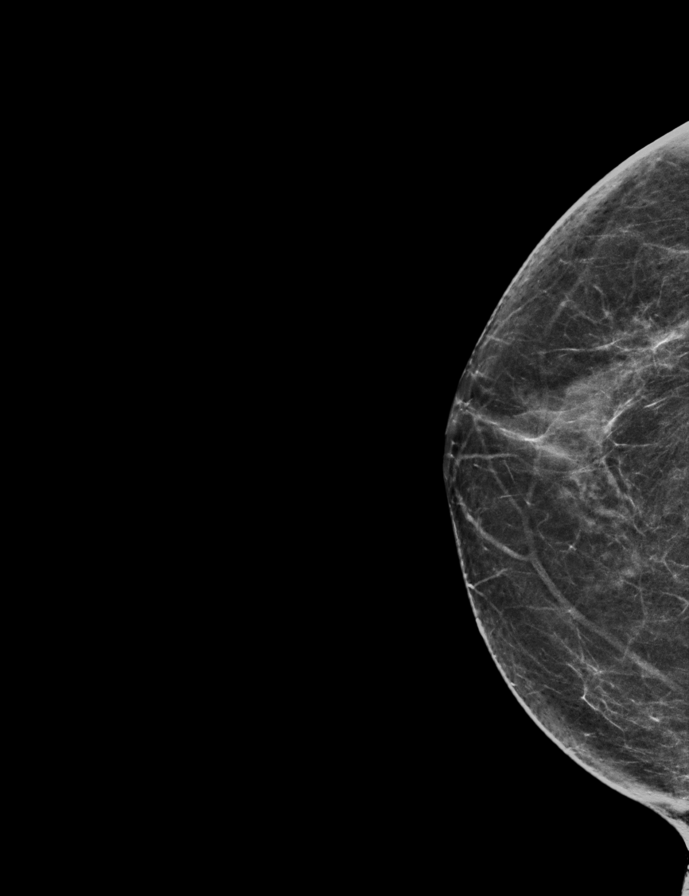

[R MLO synth-2D]
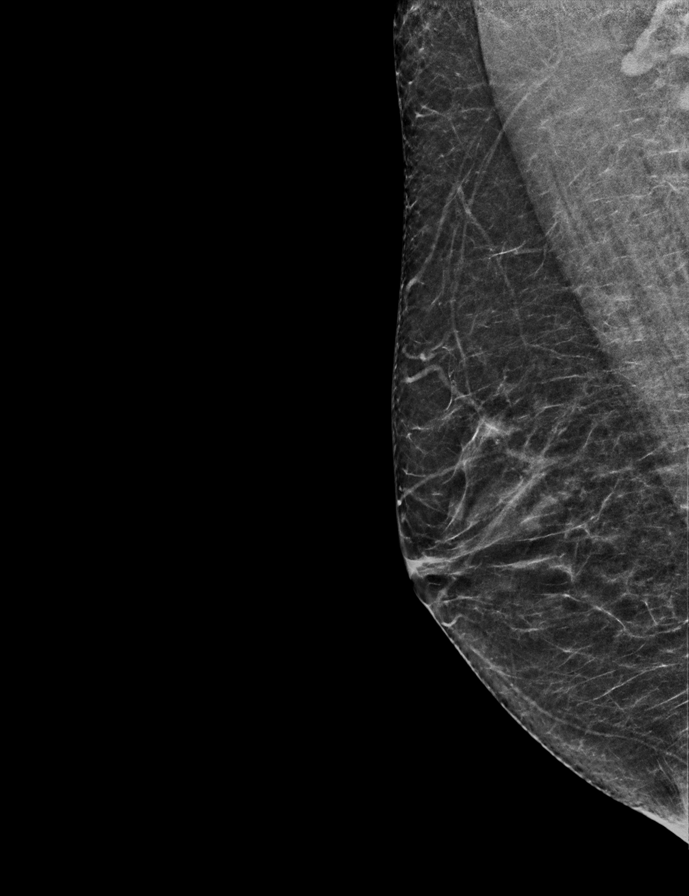

[L CC synth-2D]
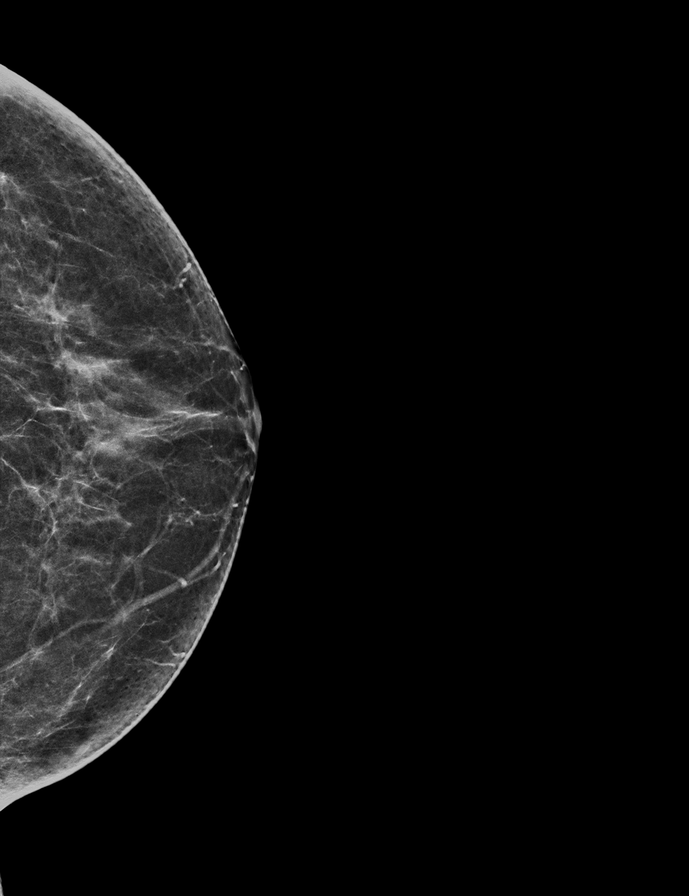

[L MLO synth-2D]
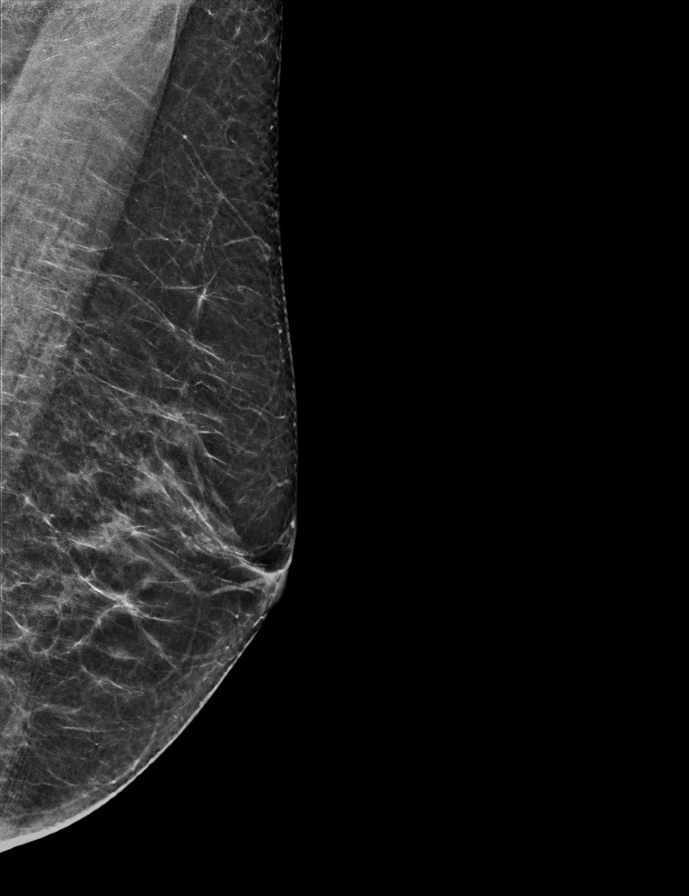

[R MLO tomo · 2 of 54 frames shown]
[frame 18/54]
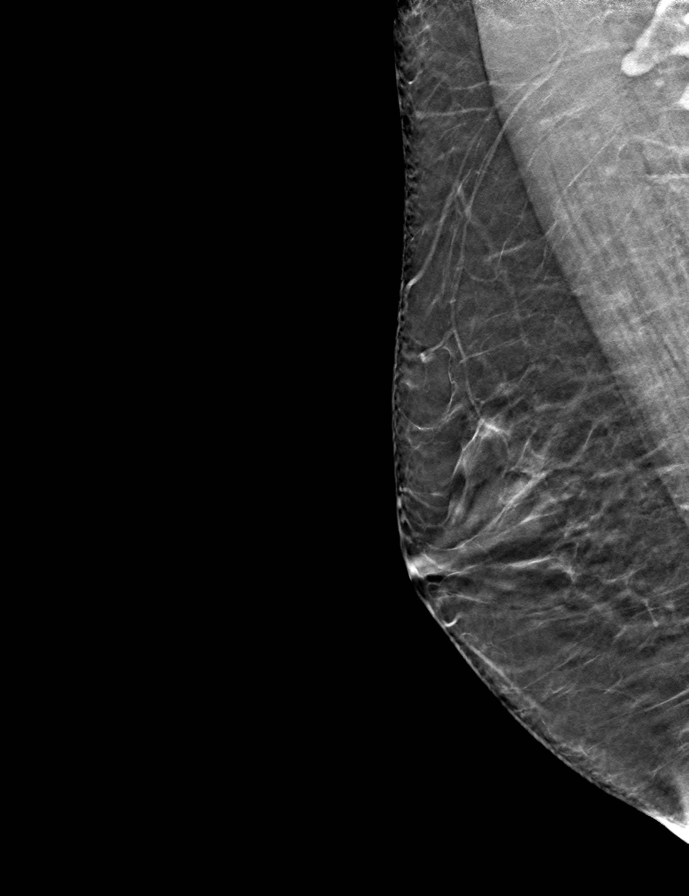
[frame 27/54]
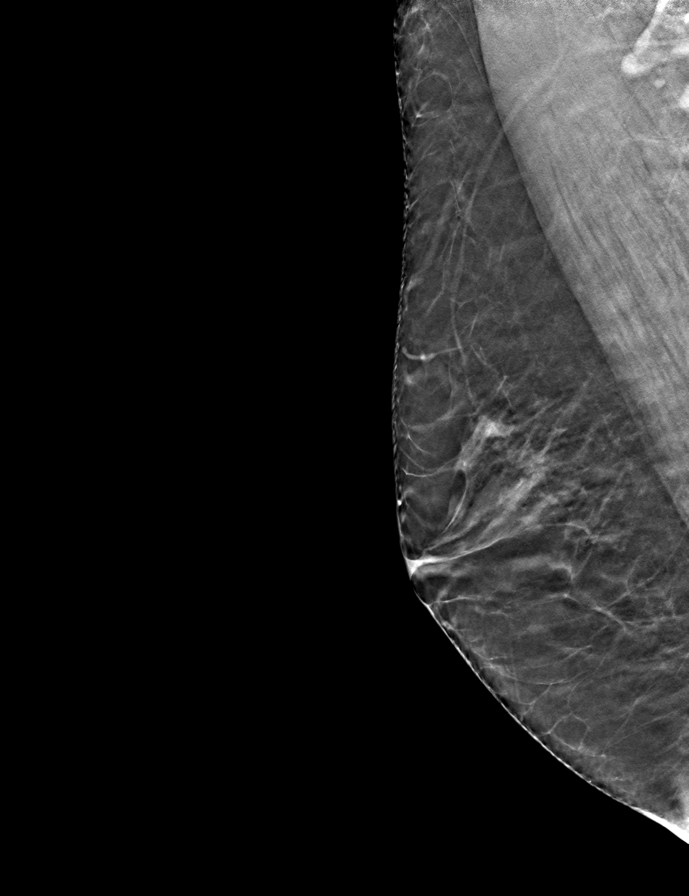

[L CC tomo · tomo slice 30/59.0]
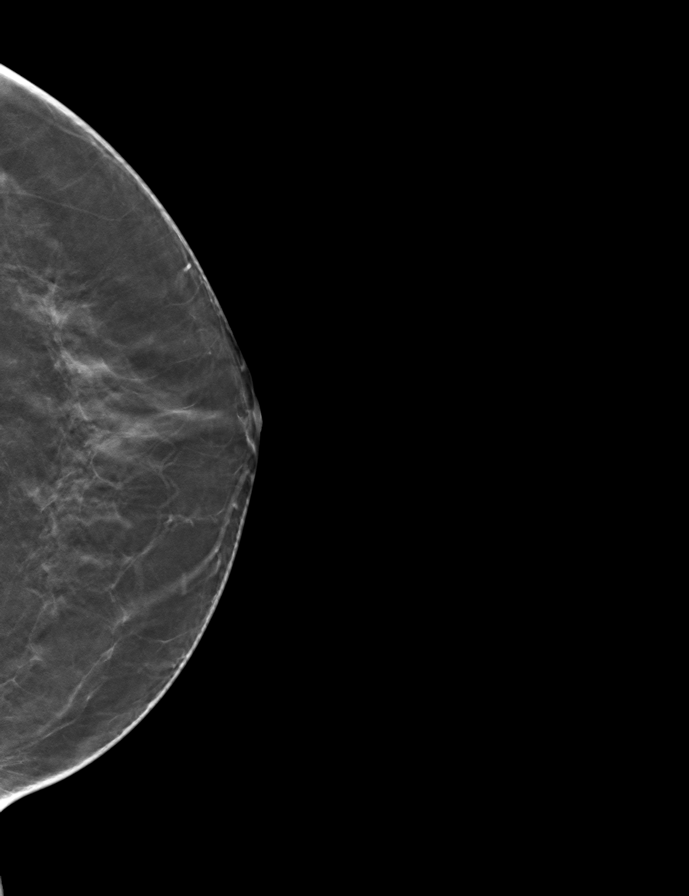

[R CC tomo · tomo slice 29/57.0]
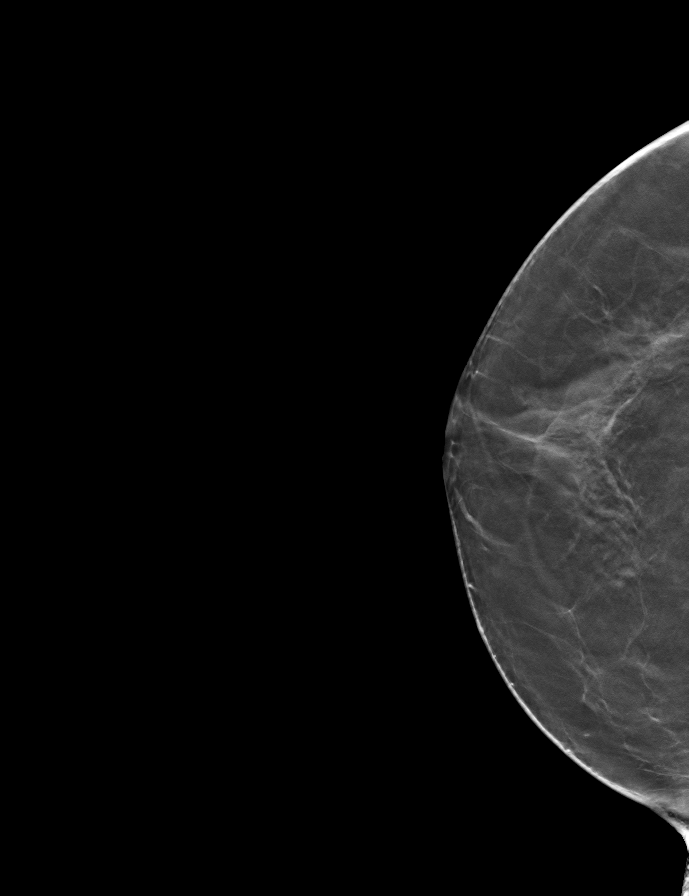

[L MLO tomo · tomo slice 29/57.0]
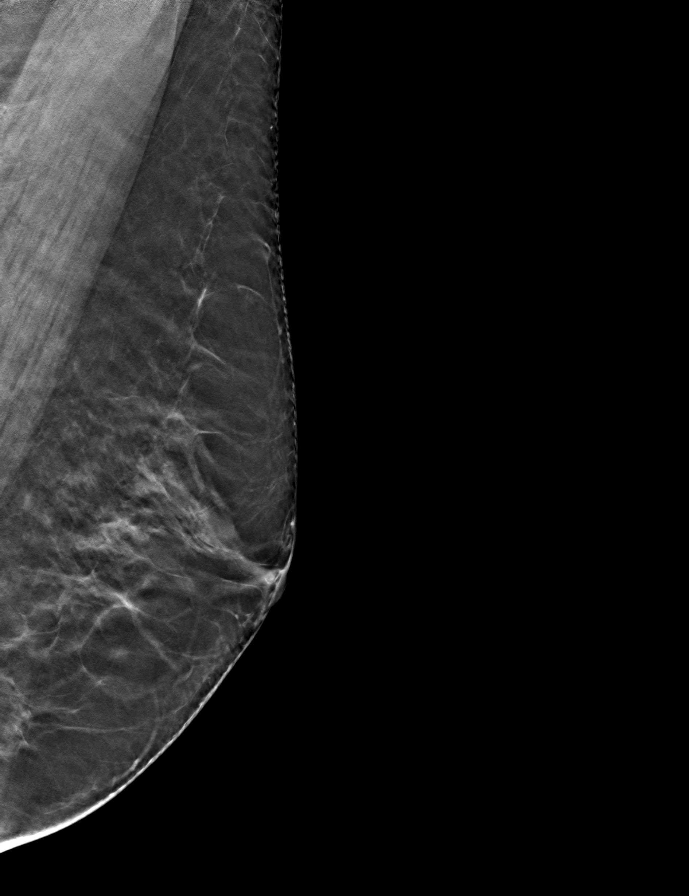

[9 of 24 positions shown; findings below may reference images not displayed]

ACR Breast Density Category b: There are scattered areas of
fibroglandular density.
FINDINGS: There are no findings suspicious for malignancy. Images were
processed with CAD.
IMPRESSION: No mammographic evidence of malignancy. A result letter of this
screening mammogram will be mailed directly to the patient.

RECOMMENDATION:
Screening mammogram in one year. (Code:CN-U-775)

BI-RADS CATEGORY  1: Negative.

## 2022-01-09 ENCOUNTER — Other Ambulatory Visit: Payer: Self-pay | Admitting: Internal Medicine

## 2022-03-13 ENCOUNTER — Other Ambulatory Visit: Payer: Self-pay | Admitting: Internal Medicine

## 2022-03-21 ENCOUNTER — Encounter: Payer: Self-pay | Admitting: Internal Medicine

## 2022-03-21 ENCOUNTER — Ambulatory Visit (INDEPENDENT_AMBULATORY_CARE_PROVIDER_SITE_OTHER): Payer: BC Managed Care – PPO | Admitting: Internal Medicine

## 2022-03-21 VITALS — BP 138/80 | HR 68 | Temp 98.1°F | Resp 17 | Ht 62.0 in | Wt 136.6 lb

## 2022-03-21 DIAGNOSIS — I10 Essential (primary) hypertension: Secondary | ICD-10-CM

## 2022-03-21 DIAGNOSIS — E7800 Pure hypercholesterolemia, unspecified: Secondary | ICD-10-CM

## 2022-03-21 DIAGNOSIS — R519 Headache, unspecified: Secondary | ICD-10-CM

## 2022-03-21 DIAGNOSIS — Z0000 Encounter for general adult medical examination without abnormal findings: Secondary | ICD-10-CM | POA: Diagnosis not present

## 2022-03-21 DIAGNOSIS — E2839 Other primary ovarian failure: Secondary | ICD-10-CM | POA: Diagnosis not present

## 2022-03-21 DIAGNOSIS — D582 Other hemoglobinopathies: Secondary | ICD-10-CM

## 2022-03-21 DIAGNOSIS — Z1231 Encounter for screening mammogram for malignant neoplasm of breast: Secondary | ICD-10-CM | POA: Diagnosis not present

## 2022-03-21 DIAGNOSIS — F439 Reaction to severe stress, unspecified: Secondary | ICD-10-CM

## 2022-03-21 DIAGNOSIS — R109 Unspecified abdominal pain: Secondary | ICD-10-CM

## 2022-03-21 DIAGNOSIS — R0989 Other specified symptoms and signs involving the circulatory and respiratory systems: Secondary | ICD-10-CM

## 2022-03-21 DIAGNOSIS — R1900 Intra-abdominal and pelvic swelling, mass and lump, unspecified site: Secondary | ICD-10-CM

## 2022-03-21 LAB — HEPATIC FUNCTION PANEL
ALT: 21 U/L (ref 0–35)
AST: 19 U/L (ref 0–37)
Albumin: 4.5 g/dL (ref 3.5–5.2)
Alkaline Phosphatase: 92 U/L (ref 39–117)
Bilirubin, Direct: 0.2 mg/dL (ref 0.0–0.3)
Total Bilirubin: 1.3 mg/dL — ABNORMAL HIGH (ref 0.2–1.2)
Total Protein: 6.7 g/dL (ref 6.0–8.3)

## 2022-03-21 LAB — CBC WITH DIFFERENTIAL/PLATELET
Basophils Absolute: 0 10*3/uL (ref 0.0–0.1)
Basophils Relative: 0.6 % (ref 0.0–3.0)
Eosinophils Absolute: 0.1 10*3/uL (ref 0.0–0.7)
Eosinophils Relative: 2.8 % (ref 0.0–5.0)
HCT: 43 % (ref 36.0–46.0)
Hemoglobin: 14.4 g/dL (ref 12.0–15.0)
Lymphocytes Relative: 38.6 % (ref 12.0–46.0)
Lymphs Abs: 1.6 10*3/uL (ref 0.7–4.0)
MCHC: 33.5 g/dL (ref 30.0–36.0)
MCV: 95.5 fl (ref 78.0–100.0)
Monocytes Absolute: 0.4 10*3/uL (ref 0.1–1.0)
Monocytes Relative: 10.7 % (ref 3.0–12.0)
Neutro Abs: 1.9 10*3/uL (ref 1.4–7.7)
Neutrophils Relative %: 47.3 % (ref 43.0–77.0)
Platelets: 196 10*3/uL (ref 150.0–400.0)
RBC: 4.5 Mil/uL (ref 3.87–5.11)
RDW: 12.8 % (ref 11.5–15.5)
WBC: 4.1 10*3/uL (ref 4.0–10.5)

## 2022-03-21 LAB — LIPID PANEL
Cholesterol: 191 mg/dL (ref 0–200)
HDL: 60.8 mg/dL (ref >39.00)
LDL Cholesterol: 100 mg/dL — ABNORMAL HIGH (ref 0–99)
NonHDL: 129.93
Total CHOL/HDL Ratio: 3
Triglycerides: 149 mg/dL (ref 0.0–149.0)
VLDL: 29.8 mg/dL (ref 0.0–40.0)

## 2022-03-21 LAB — BASIC METABOLIC PANEL
BUN: 17 mg/dL (ref 6–23)
CO2: 31 mEq/L (ref 19–32)
Calcium: 9.5 mg/dL (ref 8.4–10.5)
Chloride: 100 mEq/L (ref 96–112)
Creatinine, Ser: 0.69 mg/dL (ref 0.40–1.20)
GFR: 92.05 mL/min (ref >60.00)
Glucose, Bld: 90 mg/dL (ref 70–99)
Potassium: 4 mEq/L (ref 3.5–5.1)
Sodium: 139 mEq/L (ref 135–145)

## 2022-03-21 LAB — TSH: TSH: 1.42 u[IU]/mL (ref 0.35–5.50)

## 2022-03-21 NOTE — Progress Notes (Unsigned)
Patient ID: Melinda Barber, female   DOB: [birth year], 66 y.o.   MRN: [id number]   Subjective:    Patient ID: Melinda Barber, female    DOB: December 17, 1955, 66 y.o.   MRN: [id number]  This visit occurred during the SARS-CoV-2 public health emergency.  Safety protocols were in place, including screening questions prior to the visit, additional usage of staff PPE, and extensive cleaning of exam room while observing appropriate contact time as indicated for disinfecting solutions.   Patient here for her physical.    HPI    Past Medical History:  Diagnosis Date   Hypercholesterolemia    Hypertension    No past surgical history on file. Family History  Problem Relation Age of Onset   Breast cancer Other 44   Hypertension Mother    Hypercholesterolemia Mother    Diabetes Mother    Transient ischemic attack Mother    Aneurysm Father    Hypertension Brother    Aneurysm Brother        heart   Colon cancer Neg Hx    Social History   Socioeconomic History   Marital status: Married    Spouse name: Not on file   Number of children: 3   Years of education: Not on file   Highest education level: Not on file  Occupational History   Not on file  Tobacco Use   Smoking status: Former    Types: Cigarettes    Quit date: 09/20/1991    Years since quitting: 30.5   Smokeless tobacco: Never  Substance and Sexual Activity   Alcohol use: Yes    Alcohol/week: 0.0 standard drinks of alcohol   Drug use: No   Sexual activity: Not on file  Other Topics Concern   Not on file  Social History Narrative   Not on file   Social Determinants of Health   Financial Resource Strain: Not on file  Food Insecurity: Not on file  Transportation Needs: Not on file  Physical Activity: Not on file  Stress: Not on file  Social Connections: Not on file     Review of Systems     Objective:     BP 138/80 (BP Location: Left Arm, Patient Position: Sitting, Cuff Size: Small)   Pulse 68   Temp 98.1 F (36.7  C) (Temporal)   Resp 17   Ht 5\' 2"  (1.575 m)   Wt 136 lb 9.6 oz (62 kg)   LMP 06/28/2006   SpO2 97%   BMI 24.98 kg/m  Wt Readings from Last 3 Encounters:  03/21/22 136 lb 9.6 oz (62 kg)  07/27/21 137 lb 3.2 oz (62.2 kg)  03/18/21 133 lb 6.4 oz (60.5 kg)    Physical Exam   Outpatient Encounter Medications as of 03/21/2022  Medication Sig   Cholecalciferol 25 MCG (1000 UT) tablet Take 1,000 Units by mouth daily.   losartan-hydrochlorothiazide (HYZAAR) 100-25 MG tablet TAKE 1 TABLET BY MOUTH DAILY   metoprolol succinate (TOPROL-XL) 25 MG 24 hr tablet TAKE 1 TABLET BY MOUTH EVERY DAY   Multiple Vitamin (MULTIVITAMIN) capsule Take 1 capsule by mouth daily.   amLODipine (NORVASC) 10 MG tablet TAKE 1 TABLET BY MOUTH EVERY DAY (Patient not taking: Reported on 03/21/2022)   No facility-administered encounter medications on file as of 03/21/2022.     Lab Results  Component Value Date   WBC 4.6 03/16/2021   HGB 14.5 03/16/2021   HCT 42.8 03/16/2021   PLT 221.0 03/16/2021   GLUCOSE 89 07/27/2021  CHOL 194 07/27/2021   TRIG 107.0 07/27/2021   HDL 67.80 07/27/2021   LDLDIRECT 112.0 06/14/2018   LDLCALC 105 (H) 07/27/2021   ALT 20 07/27/2021   AST 22 07/27/2021   NA 140 07/27/2021   K 3.6 07/27/2021   CL 101 07/27/2021   CREATININE 0.61 07/27/2021   BUN 12 07/27/2021   CO2 31 07/27/2021   TSH 1.75 03/16/2021   HGBA1C 5.3 03/27/2019    MM 3D SCREEN BREAST BILATERAL  Result Date: 05/21/2021 CLINICAL DATA:  Screening. EXAM: DIGITAL SCREENING BILATERAL MAMMOGRAM WITH TOMOSYNTHESIS AND CAD TECHNIQUE: Bilateral screening digital craniocaudal and mediolateral oblique mammograms were obtained. Bilateral screening digital breast tomosynthesis was performed. The images were evaluated with computer-aided detection. COMPARISON:  Previous exam(s). ACR Breast Density Category b: There are scattered areas of fibroglandular density. FINDINGS: There are no findings suspicious for malignancy.  IMPRESSION: No mammographic evidence of malignancy. A result letter of this screening mammogram will be mailed directly to the patient. RECOMMENDATION: Screening mammogram in one year. (Code:SM-B-01Y) BI-RADS CATEGORY  1: Negative. Electronically Signed   By: Sherron Ales M.D.   On: 05/21/2021 08:33      Assessment & Plan:   Problem List Items Addressed This Visit     Hyperbilirubinemia   Hypercholesterolemia - Primary   Hypertension     Melinda Orient, MD

## 2022-03-21 NOTE — Patient Instructions (Addendum)
Saline nasal spray - flush nose 2x/day  Nasacort nasal spray - 2 sprays each nostril one time per day.  Do this in the evening.   Pepcid 20mg  - take 30 minutes before your evening meal  Calcium with vitamin d (500 or 600mg  tablets) twice a day.

## 2022-03-21 NOTE — Assessment & Plan Note (Signed)
Physical today 03/21/22.  PAP 03/18/21 - negative with negative HPV.  Atrophy changes.  Colonoscopy 05/2019 - diverticulosis and internal hemorrhoids. Mammogram 05/21/21 - Birads I.

## 2022-03-22 ENCOUNTER — Encounter: Payer: Self-pay | Admitting: Internal Medicine

## 2022-03-22 DIAGNOSIS — R519 Headache, unspecified: Secondary | ICD-10-CM | POA: Insufficient documentation

## 2022-03-22 DIAGNOSIS — R0989 Other specified symptoms and signs involving the circulatory and respiratory systems: Secondary | ICD-10-CM | POA: Insufficient documentation

## 2022-03-22 DIAGNOSIS — R109 Unspecified abdominal pain: Secondary | ICD-10-CM | POA: Insufficient documentation

## 2022-03-22 NOTE — Assessment & Plan Note (Signed)
Discussed.  Overall feels she is handling things relatively well.  Follow.

## 2022-03-22 NOTE — Assessment & Plan Note (Signed)
Blood pressure as outlined.  Elevated.  Restart amlodipine 10mg  q day.  Continue losartan/hctz.  Follow pressures.  Follow metabolic panel

## 2022-03-22 NOTE — Assessment & Plan Note (Signed)
Follow liver panel.  

## 2022-03-22 NOTE — Assessment & Plan Note (Signed)
The 10-year ASCVD risk score (Arnett DK, et al., 2019) is: 6.5%   Values used to calculate the score:     Age: 66 years     Sex: Female     Is Non-Hispanic African American: No     Diabetic: No     Tobacco smoker: No     Systolic Blood Pressure: 138 mmHg     Is BP treated: Yes     HDL Cholesterol: 60.8 mg/dL     Total Cholesterol: 191 mg/dL  Low cholesterol diet and exercise.  Follow lipid panel.

## 2022-03-22 NOTE — Assessment & Plan Note (Signed)
Schedule carotid ultrasound.  

## 2022-03-22 NOTE — Assessment & Plan Note (Signed)
Feels related to humidity.  Some sinus pressure and stuffy nose.  Saline nasal spray and steroid nasal spray as directed.  Follow.  Notify me if persistent.

## 2022-03-22 NOTE — Assessment & Plan Note (Signed)
Follow cbc.  

## 2022-03-22 NOTE — Assessment & Plan Note (Signed)
Abdominal pain as outlined.  Flares intermittently.  Some occasional burping.  Discussed further w/up and evaluation.  Discussed CT scan.  Wants to hold on scan.  Start pepcid.  Declines further w/up at this time.  Follow.  Call with update.

## 2022-03-22 NOTE — Assessment & Plan Note (Signed)
Had referred to surgery for further evaluation.  She had to cancel her appt due to her mother's health issues.  She will call to reschedule.

## 2022-04-08 ENCOUNTER — Other Ambulatory Visit: Payer: Self-pay | Admitting: Internal Medicine

## 2022-04-11 ENCOUNTER — Telehealth: Payer: Self-pay

## 2022-04-11 ENCOUNTER — Ambulatory Visit (INDEPENDENT_AMBULATORY_CARE_PROVIDER_SITE_OTHER): Payer: BC Managed Care – PPO

## 2022-04-11 DIAGNOSIS — R0989 Other specified symptoms and signs involving the circulatory and respiratory systems: Secondary | ICD-10-CM | POA: Diagnosis not present

## 2022-04-11 NOTE — Telephone Encounter (Signed)
Melinda Barber called from [facility] in [place].  [name] states patient accepted an opportunity to come in at 11:30am today for her carotid ultrasound.  [name] states she needs to know if prior authorization is required for the carotid ultrasound.   I spoke with our Referral Coordinator, Rockwell Alexandria, and she states prior authorization is not required.  Jonn Shingles will put a note in patient's appointment desk and send a Teams message to [facility], per Sabrina's request.

## 2022-05-03 ENCOUNTER — Ambulatory Visit: Payer: BC Managed Care – PPO | Admitting: Internal Medicine

## 2022-05-03 ENCOUNTER — Encounter: Payer: Self-pay | Admitting: Internal Medicine

## 2022-05-03 DIAGNOSIS — F439 Reaction to severe stress, unspecified: Secondary | ICD-10-CM

## 2022-05-03 DIAGNOSIS — Z1231 Encounter for screening mammogram for malignant neoplasm of breast: Secondary | ICD-10-CM

## 2022-05-03 DIAGNOSIS — I10 Essential (primary) hypertension: Secondary | ICD-10-CM | POA: Diagnosis not present

## 2022-05-03 DIAGNOSIS — E7800 Pure hypercholesterolemia, unspecified: Secondary | ICD-10-CM

## 2022-05-03 DIAGNOSIS — K4090 Unilateral inguinal hernia, without obstruction or gangrene, not specified as recurrent: Secondary | ICD-10-CM

## 2022-05-03 NOTE — Progress Notes (Signed)
Patient ID: Melinda Barber, female   DOB: July 21, 1956, 66 y.o.   MRN: [id number]   Subjective:    Patient ID: Melinda Barber, female    DOB: [birth year], 66 y.o.   MRN: [id number]   Patient here for a scheduled follow up.   Chief Complaint  Patient presents with   Follow-up    6 week follow up   .   HPI Here to follow up regarding her blood pressure and cholesterol.  Also previously having some GI issues.  She saw Dr Lemar Livings in 07/2021 - enlarging right inguinal hernia.  Last visit, reported to me some intermittent abdominal pain, occasional burping.  Started on pepcid.  Since her last visit, she reports only one episode of abdominal issues.  Cucumbers trigger.  If she watches what she eats, ok.  No chest pain.  Breathing stable.  No increased cough or congestion.  Handling stress.     Past Medical History:  Diagnosis Date   Hypercholesterolemia    Hypertension    History reviewed. No pertinent surgical history. Family History  Problem Relation Age of Onset   Breast cancer Other 61   Hypertension Mother    Hypercholesterolemia Mother    Diabetes Mother    Transient ischemic attack Mother    Aneurysm Father    Hypertension Brother    Aneurysm Brother        heart   Colon cancer Neg Hx    Social History   Socioeconomic History   Marital status: Married    Spouse name: Not on file   Number of children: 3   Years of education: Not on file   Highest education level: Not on file  Occupational History   Not on file  Tobacco Use   Smoking status: Former    Types: Cigarettes    Quit date: 09/20/1991    Years since quitting: 30.6   Smokeless tobacco: Never  Substance and Sexual Activity   Alcohol use: Yes    Alcohol/week: 0.0 standard drinks of alcohol   Drug use: No   Sexual activity: Not on file  Other Topics Concern   Not on file  Social History Narrative   Not on file   Social Determinants of Health   Financial Resource Strain: Not on file  Food  Insecurity: Not on file  Transportation Needs: Not on file  Physical Activity: Not on file  Stress: Not on file  Social Connections: Not on file     Review of Systems  Constitutional:  Negative for appetite change and unexpected weight change.  HENT:  Negative for congestion and sinus pressure.   Respiratory:  Negative for cough, chest tightness and shortness of breath.   Cardiovascular:  Negative for chest pain, palpitations and leg swelling.  Gastrointestinal:  Negative for abdominal pain, diarrhea, nausea and vomiting.  Genitourinary:  Negative for difficulty urinating and dysuria.  Musculoskeletal:  Negative for joint swelling and myalgias.  Skin:  Negative for color change and rash.  Neurological:  Negative for dizziness, light-headedness and headaches.  Psychiatric/Behavioral:  Negative for agitation and dysphoric mood.        Objective:     BP 112/66 (BP Location: Left Arm, Patient Position: Sitting, Cuff Size: Normal)   Pulse 71   Temp 97.9 F (36.6 C) (Oral)   Resp 15   Ht 5\' 2"  (1.575 m)   Wt 138 lb 12.8 oz (63 kg)   LMP 06/28/2006   SpO2 99%   BMI 25.39 kg/m  Wt Readings from Last 3 Encounters:  05/03/22 138 lb 12.8 oz (63 kg)  03/21/22 136 lb 9.6 oz (62 kg)  07/27/21 137 lb 3.2 oz (62.2 kg)    Physical Exam Vitals reviewed.  Constitutional:      General: She is not in acute distress.    Appearance: Normal appearance.  HENT:     Head: Normocephalic and atraumatic.     Right Ear: External ear normal.     Left Ear: External ear normal.  Eyes:     General: No scleral icterus.       Right eye: No discharge.        Left eye: No discharge.     Conjunctiva/sclera: Conjunctivae normal.  Neck:     Thyroid: No thyromegaly.  Cardiovascular:     Rate and Rhythm: Normal rate and regular rhythm.  Pulmonary:     Effort: No respiratory distress.     Breath sounds: Normal breath sounds. No wheezing.  Abdominal:     General: Bowel sounds are normal.      Palpations: Abdomen is soft.     Tenderness: There is no abdominal tenderness.  Musculoskeletal:        General: No swelling or tenderness.     Cervical back: Neck supple. No tenderness.  Lymphadenopathy:     Cervical: No cervical adenopathy.  Skin:    Findings: No erythema or rash.  Neurological:     Mental Status: She is alert.  Psychiatric:        Mood and Affect: Mood normal.        Behavior: Behavior normal.      Outpatient Encounter Medications as of 05/03/2022  Medication Sig   amLODipine (NORVASC) 10 MG tablet TAKE 1 TABLET BY MOUTH EVERY DAY   calcium carbonate (OSCAL) 1500 (600 Ca) MG TABS tablet Take 1,500 mg by mouth 2 (two) times daily with a meal.   losartan-hydrochlorothiazide (HYZAAR) 100-25 MG tablet TAKE 1 TABLET BY MOUTH DAILY   metoprolol succinate (TOPROL-XL) 25 MG 24 hr tablet TAKE 1 TABLET BY MOUTH EVERY DAY   Multiple Vitamin (MULTIVITAMIN) capsule Take 1 capsule by mouth daily.   [DISCONTINUED] Cholecalciferol 25 MCG (1000 UT) tablet Take 1,000 Units by mouth daily. (Patient not taking: Reported on 05/03/2022)   No facility-administered encounter medications on file as of 05/03/2022.     Lab Results  Component Value Date   WBC 4.1 03/21/2022   HGB 14.4 03/21/2022   HCT 43.0 03/21/2022   PLT 196.0 03/21/2022   GLUCOSE 90 03/21/2022   CHOL 191 03/21/2022   TRIG 149.0 03/21/2022   HDL 60.80 03/21/2022   LDLDIRECT 112.0 06/14/2018   LDLCALC 100 (H) 03/21/2022   ALT 21 03/21/2022   AST 19 03/21/2022   NA 139 03/21/2022   K 4.0 03/21/2022   CL 100 03/21/2022   CREATININE 0.69 03/21/2022   BUN 17 03/21/2022   CO2 31 03/21/2022   TSH 1.42 03/21/2022   HGBA1C 5.3 03/27/2019    MM 3D SCREEN BREAST BILATERAL  Result Date: 05/21/2021 CLINICAL DATA:  Screening. EXAM: DIGITAL SCREENING BILATERAL MAMMOGRAM WITH TOMOSYNTHESIS AND CAD TECHNIQUE: Bilateral screening digital craniocaudal and mediolateral oblique mammograms were obtained. Bilateral screening  digital breast tomosynthesis was performed. The images were evaluated with computer-aided detection. COMPARISON:  Previous exam(s). ACR Breast Density Category b: There are scattered areas of fibroglandular density. FINDINGS: There are no findings suspicious for malignancy. IMPRESSION: No mammographic evidence of malignancy. A result letter of this screening mammogram  will be mailed directly to the patient. RECOMMENDATION: Screening mammogram in one year. (Code:SM-B-01Y) BI-RADS CATEGORY  1: Negative. Electronically Signed   By: Sherron Ales M.D.   On: 05/21/2021 08:33      Assessment & Plan:   Problem List Items Addressed This Visit     Breast cancer screening    Scheduled for mammogram 05/19/22.       Hypercholesterolemia    The 10-year ASCVD risk score (Arnett DK, et al., 2019) is: 4.8%   Values used to calculate the score:     Age: 74 years     Sex: Female     Is Non-Hispanic African American: No     Diabetic: No     Tobacco smoker: No     Systolic Blood Pressure: 112 mmHg     Is BP treated: Yes     HDL Cholesterol: 60.8 mg/dL     Total Cholesterol: 191 mg/dL  Low cholesterol diet and exercise.  Follow lipid panel.       Relevant Orders   Lipid panel   Hepatic function panel   Hypertension    Blood pressure as outlined.  On amlodipine 10mg  q day and losartan/hctz.  Follow pressures.  Follow metabolic panel       Relevant Orders   Basic metabolic panel   Inguinal hernia    Saw Dr .  Enlarging inguinal hernia. Overall doing well.  No increased pain.  Follow.  Wants to monitor.        Stress    Discussed.  Overall feels she is handling things relatively well.  Follow.         Lemar Livings, MD

## 2022-05-15 ENCOUNTER — Encounter: Payer: Self-pay | Admitting: Internal Medicine

## 2022-05-15 DIAGNOSIS — Z1239 Encounter for other screening for malignant neoplasm of breast: Secondary | ICD-10-CM | POA: Insufficient documentation

## 2022-05-15 NOTE — Assessment & Plan Note (Signed)
Scheduled for mammogram 05/19/22.

## 2022-05-15 NOTE — Assessment & Plan Note (Signed)
Discussed.  Overall feels she is handling things relatively well.  Follow.

## 2022-05-15 NOTE — Assessment & Plan Note (Signed)
The 10-year ASCVD risk score (Arnett DK, et al., 2019) is: 4.8%   Values used to calculate the score:     Age: 66 years     Sex: Female     Is Non-Hispanic African American: No     Diabetic: No     Tobacco smoker: No     Systolic Blood Pressure: 112 mmHg     Is BP treated: Yes     HDL Cholesterol: 60.8 mg/dL     Total Cholesterol: 191 mg/dL  Low cholesterol diet and exercise.  Follow lipid panel.

## 2022-05-15 NOTE — Assessment & Plan Note (Signed)
Saw Dr Lemar Livings.  Enlarging inguinal hernia. Overall doing well.  No increased pain.  Follow.  Wants to monitor.

## 2022-05-15 NOTE — Assessment & Plan Note (Signed)
Blood pressure as outlined.  On amlodipine 10mg q day and losartan/hctz.  Follow pressures.  Follow metabolic panel  

## 2022-05-19 ENCOUNTER — Ambulatory Visit
Admission: RE | Admit: 2022-05-19 | Discharge: 2022-05-19 | Disposition: A | Discharge status: Home / Self Care | Payer: BC Managed Care – PPO | Source: Ambulatory Visit | Attending: Internal Medicine | Admitting: Internal Medicine

## 2022-05-19 DIAGNOSIS — Z1231 Encounter for screening mammogram for malignant neoplasm of breast: Secondary | ICD-10-CM | POA: Insufficient documentation

## 2022-05-19 DIAGNOSIS — E2839 Other primary ovarian failure: Secondary | ICD-10-CM | POA: Insufficient documentation

## 2022-06-21 ENCOUNTER — Other Ambulatory Visit: Payer: Self-pay | Admitting: General Surgery

## 2022-06-29 ENCOUNTER — Ambulatory Visit (INDEPENDENT_AMBULATORY_CARE_PROVIDER_SITE_OTHER): Payer: BC Managed Care – PPO

## 2022-06-29 DIAGNOSIS — Z23 Encounter for immunization: Secondary | ICD-10-CM

## 2022-07-14 ENCOUNTER — Other Ambulatory Visit: Payer: Self-pay | Admitting: General Surgery

## 2022-07-14 NOTE — Progress Notes (Signed)
Progress Notes - documented in this encounter Rustyn Conery, Albina Billet, MD - 07/14/2022 10:45 AM EDT Formatting of this note is different from the original. Images from the original note were not included. Subjective:   Patient ID: Melinda Barber is a 66 y.o. female.  HPI  The following portions of the patient's history were reviewed and updated as appropriate.  This an established patient is here today for: office visit. Here for her preoperative vitis, open right inguinal hernia repair on 07-25-22.  Review of Systems  Constitutional: Negative for chills and fever.  Respiratory: Negative for cough.   Chief Complaint  Patient presents with  Pre-op Exam    BP 136/80  Pulse 69  Temp 36.7 C (98 F)  Ht 157.5 cm (5\' 2" )  Wt 61.7 kg (136 lb)  SpO2 98%  BMI 24.87 kg/m   Past Medical History:  Diagnosis Date  Colon polyp 06/26/2014  tubular adenoma  Hypercholesterolemia  Hypertension    Past Surgical History:  Procedure Laterality Date  COLONOSCOPY 06/26/2014  Dr. 08/26/2014 @ [facility] - Adenomatous Polyp (67mm), rpt 3 yrs per [facility]  COLONOSCOPY 06/10/2019  Diverticulosis/PHx CP/Repeat 54yrs/[facility]    OB History   Gravida  3  Para  3  Term   Preterm   AB   Living     SAB   IAB   Ectopic   Molar   Multiple   Live Births     Obstetric Comments  Age at first period 81 Age of first pregnancy 46      Social History   Socioeconomic History  Marital status: Married  Tobacco Use  Smoking status: Former  Types: Cigarettes  Quit date: 09/19/1988  Years since quitting: 33.8  Smokeless tobacco: Never  Substance and Sexual Activity  Alcohol use: Yes  Comment: socially  Drug use: No  Sexual activity: Defer    No Known Allergies  Current Outpatient Medications  Medication Sig Dispense Refill  amLODIPine (NORVASC) 5 MG tablet Take 5 mg by mouth once daily.  calcium carbonate/vitamin D3 (CALTRATE 600 + D ORAL) Take 1 tablet by mouth 2 (two)  times daily  losartan-hydrochlorothiazide (HYZAAR) 100-25 mg tablet Take 1 tablet by mouth once daily.  metoprolol succinate (TOPROL-XL) 25 MG XL tablet Take 25 mg by mouth once daily.  multivitamin capsule Take 1 capsule by mouth once daily.  VITAMIN D3 ORAL Take by mouth once daily  ascorbic acid, vitamin C, (VITAMIN C) 500 MG tablet Take 500 mg by mouth once daily (Patient not taking: Reported on 08/05/2021)  aspirin 81 MG EC tablet Take 81 mg by mouth once daily (Patient not taking: Reported on 08/05/2021)  peg-electrolyte (NULYTELY) solution Take 4,000 mLs by mouth as directed Colon Prep. (Patient not taking: Reported on 08/05/2021) 4000 mL 0   No current facility-administered medications for this visit.   Family History  Problem Relation Age of Onset  Diabetes Mother  High blood pressure (Hypertension) Mother  Hyperlipidemia (Elevated cholesterol) Mother  Transient ischemic attack Mother  Heart disease Father  Aneurysm Father  No Known Problems Sister  High blood pressure (Hypertension) Brother  Aneurysm Brother  Colon cancer Neg Hx  Colon polyps Neg Hx  Liver disease Neg Hx  Rectal cancer Neg Hx  Ulcers Neg Hx  Breast cancer Neg Hx   Labs and Radiology:   March 21, 2022 laboratory:  Sodium 135 - 145 mEq/L 139  Potassium 3.5 - 5.1 mEq/L 4.0  Chloride 96 - 112 mEq/L 100  CO2  19 - 32 mEq/L 31  Glucose, Bld 70 - 99 mg/dL 90  BUN 6 - 23 mg/dL 17  Creatinine, Ser 0.40 - 1.20 mg/dL 0.69  GFR >60.00 mL/min 92.05  Comment: Calculated using the CKD-EPI Creatinine Equation (2021) Calcium 8.4 - 10.5 mg/dL 9.5   Total Bilirubin 0.2 - 1.2 mg/dL 1.3 High  Bilirubin, Direct 0.0 - 0.3 mg/dL 0.2  Alkaline Phosphatase 39 - 117 U/L 92  AST 0 - 37 U/L 19  ALT 0 - 35 U/L 21  Total Protein 6.0 - 8.3 g/dL 6.7  Albumin 3.5 - 5.2 g/dL 4.5   WBC 4.0 - 10.5 K/uL 4.1  RBC 3.87 - 5.11 Mil/uL 4.50  Hemoglobin 12.0 - 15.0 g/dL 14.4  HCT 36.0 - 46.0 % 43.0  MCV 78.0 - 100.0 fl 95.5  MCHC  30.0 - 36.0 g/dL 33.5  RDW 11.5 - 15.5 % 12.8  Platelets 150.0 - 400.0 K/uL 196.0  Neutrophils Relative % 43.0 - 77.0 % 47.3  Lymphocytes Relative 12.0 - 46.0 % 38.6  Monocytes Relative 3.0 - 12.0 % 10.7  Eosinophils Relative 0.0 - 5.0 % 2.8  Basophils Relative 0.0 - 3.0 % 0.6  Neutro Abs 1.4 - 7.7 K/uL 1.9  Lymphs Abs 0.7 - 4.0 K/uL 1.6  Monocytes Absolute 0.1 - 1.0 K/uL 0.4  Eosinophils Absolute 0.0 - 0.7 K/uL 0.1  Basophils Absolute 0.0 - 0.1 K/uL 0.0    Objective:  Physical Exam Constitutional:  Appearance: Normal appearance.  Cardiovascular:  Rate and Rhythm: Normal rate and regular rhythm.  Pulses: Normal pulses.  Heart sounds: Normal heart sounds.  Pulmonary:  Effort: Pulmonary effort is normal.  Breath sounds: Normal breath sounds.  Abdominal:  General: Bowel sounds are normal.  Tenderness: There is no abdominal tenderness.  Hernia: A hernia is present. Hernia is present in the right inguinal area. There is no hernia in the left inguinal area.   Comments: Right inguinal hernia reducible in the supine position. "Gurgling" during reduction suggesting bowel content.  Musculoskeletal:  Cervical back: Neck supple.  Skin: General: Skin is warm and dry.  Neurological:  Mental Status: She is alert and oriented to person, place, and time.  Psychiatric:  Mood and Affect: Mood normal.  Behavior: Behavior normal.    Assessment:   Minimally symptomatic, slowly enlarging right inguinal hernia.  Plan:   Indication for elective repair were reviewed. Role of prosthetic mesh. Offer for referral to or for robotic repair reviewed and declined.  Weight restrictions after surgery. Caution with lifting, especially her 41-year-old granddaughter. Proper lifting technique reviewed.   This note is partially prepared by Karie Fetch, RN, acting as a scribe in the presence of Dr. Hervey Ard, MD.  The documentation recorded by the scribe accurately reflects the service I  personally performed and the decisions made by me.   Robert Bellow, MD FACS  Electronically signed by Mayer Masker, MD at 07/14/2022 11:21 AM EDT

## 2022-07-14 NOTE — Progress Notes (Signed)
Progress Notes - documented in this encounter Gannon Heinzman, Albina Billet, MD - 07/14/2022 10:45 AM EDT Formatting of this note is different from the original. Images from the original note were not included. Subjective:   Patient ID: Melinda Barber is a 66 y.o. female.  HPI  The following portions of the patient's history were reviewed and updated as appropriate.  This an established patient is here today for: office visit. Here for her preoperative vitis, open right inguinal hernia repair on 07-25-22.  Review of Systems  Constitutional: Negative for chills and fever.  Respiratory: Negative for cough.   Chief Complaint  Patient presents with  Pre-op Exam    BP 136/80  Pulse 69  Temp 36.7 C (98 F)  Ht 157.5 cm (5\' 2" )  Wt 61.7 kg (136 lb)  SpO2 98%  BMI 24.87 kg/m   Past Medical History:  Diagnosis Date  Colon polyp 06/26/2014  tubular adenoma  Hypercholesterolemia  Hypertension    Past Surgical History:  Procedure Laterality Date  COLONOSCOPY 06/26/2014  Dr. 08/26/2014 @ [facility] - Adenomatous Polyp (8mm), rpt 3 yrs per [facility]  COLONOSCOPY 06/10/2019  Diverticulosis/PHx CP/Repeat 18yrs/[facility]    OB History   Gravida  3  Para  3  Term   Preterm   AB   Living     SAB   IAB   Ectopic   Molar   Multiple   Live Births     Obstetric Comments  Age at first period 41 Age of first pregnancy 39      Social History   Socioeconomic History  Marital status: Married  Tobacco Use  Smoking status: Former  Types: Cigarettes  Quit date: 09/19/1988  Years since quitting: 33.8  Smokeless tobacco: Never  Substance and Sexual Activity  Alcohol use: Yes  Comment: socially  Drug use: No  Sexual activity: Defer    No Known Allergies  Current Outpatient Medications  Medication Sig Dispense Refill  amLODIPine (NORVASC) 5 MG tablet Take 5 mg by mouth once daily.  calcium carbonate/vitamin D3 (CALTRATE 600 + D ORAL) Take 1 tablet by mouth 2 (two)  times daily  losartan-hydrochlorothiazide (HYZAAR) 100-25 mg tablet Take 1 tablet by mouth once daily.  metoprolol succinate (TOPROL-XL) 25 MG XL tablet Take 25 mg by mouth once daily.  multivitamin capsule Take 1 capsule by mouth once daily.  VITAMIN D3 ORAL Take by mouth once daily  ascorbic acid, vitamin C, (VITAMIN C) 500 MG tablet Take 500 mg by mouth once daily (Patient not taking: Reported on 08/05/2021)  aspirin 81 MG EC tablet Take 81 mg by mouth once daily (Patient not taking: Reported on 08/05/2021)  peg-electrolyte (NULYTELY) solution Take 4,000 mLs by mouth as directed Colon Prep. (Patient not taking: Reported on 08/05/2021) 4000 mL 0   No current facility-administered medications for this visit.   Family History  Problem Relation Age of Onset  Diabetes Mother  High blood pressure (Hypertension) Mother  Hyperlipidemia (Elevated cholesterol) Mother  Transient ischemic attack Mother  Heart disease Father  Aneurysm Father  No Known Problems Sister  High blood pressure (Hypertension) Brother  Aneurysm Brother  Colon cancer Neg Hx  Colon polyps Neg Hx  Liver disease Neg Hx  Rectal cancer Neg Hx  Ulcers Neg Hx  Breast cancer Neg Hx   Labs and Radiology:   March 21, 2022 laboratory:  Sodium 135 - 145 mEq/L 139  Potassium 3.5 - 5.1 mEq/L 4.0  Chloride 96 - 112 mEq/L 100  CO2  19 - 32 mEq/L 31  Glucose, Bld 70 - 99 mg/dL 90  BUN 6 - 23 mg/dL 17  Creatinine, Ser 0.40 - 1.20 mg/dL 0.69  GFR >60.00 mL/min 92.05  Comment: Calculated using the CKD-EPI Creatinine Equation (2021) Calcium 8.4 - 10.5 mg/dL 9.5   Total Bilirubin 0.2 - 1.2 mg/dL 1.3 High  Bilirubin, Direct 0.0 - 0.3 mg/dL 0.2  Alkaline Phosphatase 39 - 117 U/L 92  AST 0 - 37 U/L 19  ALT 0 - 35 U/L 21  Total Protein 6.0 - 8.3 g/dL 6.7  Albumin 3.5 - 5.2 g/dL 4.5   WBC 4.0 - 10.5 K/uL 4.1  RBC 3.87 - 5.11 Mil/uL 4.50  Hemoglobin 12.0 - 15.0 g/dL 14.4  HCT 36.0 - 46.0 % 43.0  MCV 78.0 - 100.0 fl 95.5  MCHC  30.0 - 36.0 g/dL 33.5  RDW 11.5 - 15.5 % 12.8  Platelets 150.0 - 400.0 K/uL 196.0  Neutrophils Relative % 43.0 - 77.0 % 47.3  Lymphocytes Relative 12.0 - 46.0 % 38.6  Monocytes Relative 3.0 - 12.0 % 10.7  Eosinophils Relative 0.0 - 5.0 % 2.8  Basophils Relative 0.0 - 3.0 % 0.6  Neutro Abs 1.4 - 7.7 K/uL 1.9  Lymphs Abs 0.7 - 4.0 K/uL 1.6  Monocytes Absolute 0.1 - 1.0 K/uL 0.4  Eosinophils Absolute 0.0 - 0.7 K/uL 0.1  Basophils Absolute 0.0 - 0.1 K/uL 0.0    Objective:  Physical Exam Constitutional:  Appearance: Normal appearance.  Cardiovascular:  Rate and Rhythm: Normal rate and regular rhythm.  Pulses: Normal pulses.  Heart sounds: Normal heart sounds.  Pulmonary:  Effort: Pulmonary effort is normal.  Breath sounds: Normal breath sounds.  Abdominal:  General: Bowel sounds are normal.  Tenderness: There is no abdominal tenderness.  Hernia: A hernia is present. Hernia is present in the right inguinal area. There is no hernia in the left inguinal area.   Comments: Right inguinal hernia reducible in the supine position. "Gurgling" during reduction suggesting bowel content.  Musculoskeletal:  Cervical back: Neck supple.  Skin: General: Skin is warm and dry.  Neurological:  Mental Status: She is alert and oriented to person, place, and time.  Psychiatric:  Mood and Affect: Mood normal.  Behavior: Behavior normal.    Assessment:   Minimally symptomatic, slowly enlarging right inguinal hernia.  Plan:   Indication for elective repair were reviewed. Role of prosthetic mesh. Offer for referral to or for robotic repair reviewed and declined.  Weight restrictions after surgery. Caution with lifting, especially her 41-year-old granddaughter. Proper lifting technique reviewed.   This note is partially prepared by Karie Fetch, RN, acting as a scribe in the presence of Dr. Hervey Ard, MD.  The documentation recorded by the scribe accurately reflects the service I  personally performed and the decisions made by me.   Robert Bellow, MD FACS  Electronically signed by Mayer Masker, MD at 07/14/2022 11:21 AM EDT

## 2022-07-18 ENCOUNTER — Inpatient Hospital Stay
Admission: RE | Admit: 2022-07-18 | Discharge: 2022-07-18 | Disposition: A | Discharge status: Home / Self Care | Payer: BC Managed Care – PPO | Source: Ambulatory Visit

## 2022-07-18 HISTORY — DX: Palpitations: R00.2

## 2022-07-18 HISTORY — DX: Other specified symptoms and signs involving the circulatory and respiratory systems: R09.89

## 2022-07-18 HISTORY — DX: Tremor, unspecified: R25.1

## 2022-07-18 HISTORY — DX: Other hemoglobinopathies: D58.2

## 2022-07-18 HISTORY — DX: Unilateral inguinal hernia, without obstruction or gangrene, not specified as recurrent: K40.90

## 2022-07-18 NOTE — Pre-Procedure Instructions (Signed)
Called pt to do anesthesia interview and pt is at [facility] and is requesting to be called on Thursday once she is back in town. I told pt we would call her between 8-1 on 11-2 but that she is going to have to come in for ekg before surgery. Pt states she will be available on 11-2 for phone call and to come in to get ekg

## 2022-07-21 ENCOUNTER — Encounter
Admission: RE | Admit: 2022-07-21 | Discharge: 2022-07-21 | Disposition: A | Discharge status: Home / Self Care | Payer: BC Managed Care – PPO | Source: Ambulatory Visit | Attending: General Surgery | Admitting: General Surgery

## 2022-07-21 ENCOUNTER — Other Ambulatory Visit: Payer: Self-pay

## 2022-07-21 ENCOUNTER — Encounter: Payer: Self-pay | Admitting: Urgent Care

## 2022-07-21 VITALS — Ht 62.0 in | Wt 136.0 lb

## 2022-07-21 DIAGNOSIS — Z01812 Encounter for preprocedural laboratory examination: Secondary | ICD-10-CM

## 2022-07-21 DIAGNOSIS — Z79899 Other long term (current) drug therapy: Secondary | ICD-10-CM

## 2022-07-21 DIAGNOSIS — E876 Hypokalemia: Secondary | ICD-10-CM | POA: Diagnosis not present

## 2022-07-21 DIAGNOSIS — I10 Essential (primary) hypertension: Secondary | ICD-10-CM

## 2022-07-21 DIAGNOSIS — Z01818 Encounter for other preprocedural examination: Secondary | ICD-10-CM | POA: Insufficient documentation

## 2022-07-21 DIAGNOSIS — T502X5A Adverse effect of carbonic-anhydrase inhibitors, benzothiadiazides and other diuretics, initial encounter: Secondary | ICD-10-CM | POA: Insufficient documentation

## 2022-07-21 HISTORY — DX: Other disorders of bilirubin metabolism: E80.6

## 2022-07-21 HISTORY — DX: Other complications of anesthesia, initial encounter: T88.59XA

## 2022-07-21 HISTORY — DX: Gastro-esophageal reflux disease without esophagitis: K21.9

## 2022-07-21 LAB — BASIC METABOLIC PANEL
Anion gap: 7 (ref 5–15)
BUN: 14 mg/dL (ref 8–23)
CO2: 30 mmol/L (ref 22–32)
Calcium: 9.3 mg/dL (ref 8.9–10.3)
Chloride: 102 mmol/L (ref 98–111)
Creatinine, Ser: 0.61 mg/dL (ref 0.44–1.00)
GFR, Estimated: >60 mL/min (ref >60)
Glucose, Bld: 96 mg/dL (ref 70–99)
Potassium: 2.9 mmol/L — ABNORMAL LOW (ref 3.5–5.1)
Sodium: 139 mmol/L (ref 135–145)

## 2022-07-21 NOTE — Patient Instructions (Signed)
Your procedure is scheduled on:07-25-22 Monday Report to the Registration Desk on the 1st floor of the [facility].Then proceed to the 2nd floor Surgery Desk To find out your arrival time, please call [phone] between 1PM - 3PM on:07-22-22 Friday If your arrival time is 6:00 am, do not arrive prior to that time as the [facility] entrance doors do not open until 6:00 am.  REMEMBER: Instructions that are not followed completely may result in serious medical risk, up to and including death; or upon the discretion of your surgeon and anesthesiologist your surgery may need to be rescheduled.  Do not eat food after midnight the night before surgery.  No gum chewing, lozengers or hard candies.  You may however, drink CLEAR liquids up to 2 hours before you are scheduled to arrive for your surgery. Do not drink anything within 2 hours of your scheduled arrival time.  Clear liquids include: - water  - apple juice without pulp - gatorade (not RED colors) - black coffee or tea (Do NOT add milk or creamers to the coffee or tea) Do NOT drink anything that is not on this list.  TAKE THESE MEDICATIONS THE MORNING OF SURGERY WITH A SIP OF WATER: -amLODipine (NORVASC)   One week prior to surgery: Stop Anti-inflammatories (NSAIDS) such as Advil, Aleve, Ibuprofen, Motrin, Naproxen, Naprosyn and Aspirin based products such as Excedrin, Goodys Powder, BC Powder.You may however, continue to take Tylenol if needed for pain up until the day of surgery.  Stop ANY OVER THE COUNTER supplements/vitamins NOW (07-21-22) until after surgery (Multivitamin and Calcium)  No Alcohol for 24 hours before or after surgery.  No Smoking including e-cigarettes for 24 hours prior to surgery.  No chewable tobacco products for at least 6 hours prior to surgery.  No nicotine patches on the day of surgery.  Do not use any "recreational" drugs for at least a week prior to your surgery.  Please be advised that the  combination of cocaine and anesthesia may have negative outcomes, up to and including death. If you test positive for cocaine, your surgery will be cancelled.  On the morning of surgery brush your teeth with toothpaste and water, you may rinse your mouth with mouthwash if you wish. Do not swallow any toothpaste or mouthwash.  Use CHG Soap as directed on instruction sheet.  Do not wear jewelry, make-up, hairpins, clips or nail polish.  Do not wear lotions, powders, or perfumes.   Do not shave body from the neck down 48 hours prior to surgery just in case you cut yourself which could leave a site for infection.  Also, freshly shaved skin may become irritated if using the CHG soap.  Contact lenses, hearing aids and dentures may not be worn into surgery.  Do not bring valuables to the hospital. [facility] is not responsible for any missing/lost belongings or valuables.   Total Shoulder Arthroplasty:  use Benzolyl Peroxide 5% Gel as directed on instruction sheet.  Fleets enema or bowel prep as directed.  Bring your C-PAP to the hospital with you in case you may have to spend the night.   Notify your doctor if there is any change in your medical condition (cold, fever, infection).  Wear comfortable clothing (specific to your surgery type) to the hospital.  After surgery, you can help prevent lung complications by doing breathing exercises.  Take deep breaths and cough every 1-2 hours. Your doctor may order a device called an Incentive Spirometer to help you  take deep breaths. When coughing or sneezing, hold a pillow firmly against your incision with both hands. This is called "splinting." Doing this helps protect your incision. It also decreases belly discomfort.  If you are being admitted to the hospital overnight, leave your suitcase in the car. After surgery it may be brought to your room.  If you are being discharged the day of surgery, you will not be allowed to drive home. You  will need a responsible adult (18 years or older) to drive you home and stay with you that night.   If you are taking public transportation, you will need to have a responsible adult (18 years or older) with you. Please confirm with your physician that it is acceptable to use public transportation.   Please call the Pre-admissions Testing Dept. at [phone] if you have any questions about these instructions.  Surgery Visitation Policy:  Patients undergoing a surgery or procedure may have two family members or support persons with them as long as the person is not COVID-19 positive or experiencing its symptoms.

## 2022-07-21 NOTE — Progress Notes (Signed)
  [facility] Perioperative Services: Pre-Admission/Anesthesia Testing  Abnormal Lab Notification   Date: 07/21/22  Name: Melinda Barber MRN:   [id number]  Re: Abnormal labs noted during PAT appointment   Notified:  Provider Name Provider Role Notification Mode  Hervey Ard, MD General Surgery Note sent in [facility] with MD reply   Clinical Information and Notes:  ABNORMAL LAB VALUE(S): Lab Results  Component Value Date   K 2.9 (L) 07/21/2022   [name] is scheduled for an elective RIGHT INGUINAL HERNIA REPAIR on 07/25/2022. In review of her medication reconciliation, it is noted that the patient is taking prescribed diuretic medication (HCTZ 25 mg).   Please note, in efforts to promote a safe and effective anesthetic course, per current guidelines/standards set by the [facility] anesthesia team, the minimal acceptable K+ level for the patient to proceed with general anesthesia is 3.0 mmol/L. With that being said, if the patient drops any lower, her elective procedure will need to be postponed until K+ is better optimized.   Reached out to primary attending surgeon to inquire as to whether he would like for me to correct or if he would like to treat the noted diuretic induced hypokalemia. Received return communication from MD advising that he had sent in a prescription for the patient. No further action required by PAT APP at this time as far as preoperative K+ optimization.   Order placed to have K+ rechecked on the day of her procedure to ensure correction of the noted derangement. Given that I communicated with Dr. Bary Castilla directly, copy of this note not forwarded to MD. Note entered for documentation purposes for review by perioperative and anesthetic staff of the day of patient's procedure.   Honor Loh, MSN, APRN, FNP-C, CEN [facility]  Peri-operative Services Nurse Practitioner Phone: [phone] Fax: [phone] 07/21/22 12:42 PM

## 2022-07-25 ENCOUNTER — Encounter: Payer: Self-pay | Admitting: General Surgery

## 2022-07-25 ENCOUNTER — Other Ambulatory Visit: Payer: Self-pay

## 2022-07-25 ENCOUNTER — Ambulatory Visit
Admission: RE | Admit: 2022-07-25 | Discharge: 2022-07-25 | Disposition: A | Discharge status: Home / Self Care | Payer: BC Managed Care – PPO | Attending: General Surgery | Admitting: General Surgery

## 2022-07-25 ENCOUNTER — Ambulatory Visit: Payer: BC Managed Care – PPO | Admitting: Anesthesiology

## 2022-07-25 ENCOUNTER — Encounter
Admission: RE | Disposition: A | Discharge status: Home / Self Care | Payer: Self-pay | Source: Home / Self Care | Attending: General Surgery

## 2022-07-25 DIAGNOSIS — E7800 Pure hypercholesterolemia, unspecified: Secondary | ICD-10-CM | POA: Insufficient documentation

## 2022-07-25 DIAGNOSIS — Z01812 Encounter for preprocedural laboratory examination: Secondary | ICD-10-CM

## 2022-07-25 DIAGNOSIS — Z87891 Personal history of nicotine dependence: Secondary | ICD-10-CM | POA: Diagnosis not present

## 2022-07-25 DIAGNOSIS — K219 Gastro-esophageal reflux disease without esophagitis: Secondary | ICD-10-CM | POA: Diagnosis not present

## 2022-07-25 DIAGNOSIS — I10 Essential (primary) hypertension: Secondary | ICD-10-CM | POA: Diagnosis not present

## 2022-07-25 DIAGNOSIS — K4090 Unilateral inguinal hernia, without obstruction or gangrene, not specified as recurrent: Secondary | ICD-10-CM | POA: Diagnosis present

## 2022-07-25 DIAGNOSIS — Z79899 Other long term (current) drug therapy: Secondary | ICD-10-CM

## 2022-07-25 DIAGNOSIS — E876 Hypokalemia: Secondary | ICD-10-CM

## 2022-07-25 HISTORY — PX: INSERTION OF MESH: SHX5868

## 2022-07-25 HISTORY — PX: INGUINAL HERNIA REPAIR: SHX194

## 2022-07-25 LAB — POCT I-STAT, CHEM 8
BUN: 17 mg/dL (ref 8–23)
Calcium, Ion: 1.2 mmol/L (ref 1.15–1.40)
Chloride: 104 mmol/L (ref 98–111)
Creatinine, Ser: 0.8 mg/dL (ref 0.44–1.00)
Glucose, Bld: 97 mg/dL (ref 70–99)
HCT: 43 % (ref 36.0–46.0)
Hemoglobin: 14.6 g/dL (ref 12.0–15.0)
Potassium: 4 mmol/L (ref 3.5–5.1)
Sodium: 140 mmol/L (ref 135–145)
TCO2: 24 mmol/L (ref 22–32)

## 2022-07-25 SURGERY — REPAIR, HERNIA, INGUINAL, ADULT
Anesthesia: General | Site: Inguinal | Laterality: Right

## 2022-07-25 MED ORDER — FENTANYL CITRATE (PF) 100 MCG/2ML IJ SOLN
INTRAMUSCULAR | Status: AC
  Filled 2022-07-25: qty 2

## 2022-07-25 MED ORDER — PHENYLEPHRINE 80 MCG/ML (10ML) SYRINGE FOR IV PUSH (FOR BLOOD PRESSURE SUPPORT)
PREFILLED_SYRINGE | INTRAVENOUS | Status: DC | PRN
  Administered 2022-07-25 (×3): 80 ug via INTRAVENOUS

## 2022-07-25 MED ORDER — PROPOFOL 1000 MG/100ML IV EMUL
INTRAVENOUS | Status: AC
  Filled 2022-07-25: qty 100

## 2022-07-25 MED ORDER — ONDANSETRON HCL 4 MG/2ML IJ SOLN: 4.0000 mg | Freq: Once | INTRAMUSCULAR | Status: DC | PRN

## 2022-07-25 MED ORDER — CHLORHEXIDINE GLUCONATE 0.12 % MT SOLN: 15.0000 mL | Freq: Once | OROMUCOSAL | Status: AC

## 2022-07-25 MED ORDER — MIDAZOLAM HCL 2 MG/2ML IJ SOLN
INTRAMUSCULAR | Status: AC
  Filled 2022-07-25: qty 2

## 2022-07-25 MED ORDER — PHENYLEPHRINE HCL (PRESSORS) 10 MG/ML IV SOLN
INTRAVENOUS | Status: AC
  Filled 2022-07-25: qty 1

## 2022-07-25 MED ORDER — CEFAZOLIN SODIUM-DEXTROSE 2-4 GM/100ML-% IV SOLN
2.0000 g | INTRAVENOUS | Status: AC
  Administered 2022-07-25: 2 g via INTRAVENOUS

## 2022-07-25 MED ORDER — CHLORHEXIDINE GLUCONATE CLOTH 2 % EX PADS: 6.0000 | MEDICATED_PAD | Freq: Once | CUTANEOUS | Status: DC

## 2022-07-25 MED ORDER — ONDANSETRON HCL 4 MG/2ML IJ SOLN
INTRAMUSCULAR | Status: DC | PRN
  Administered 2022-07-25 (×2): 4 mg via INTRAVENOUS

## 2022-07-25 MED ORDER — LACTATED RINGERS IV SOLN: INTRAVENOUS | Status: DC

## 2022-07-25 MED ORDER — FENTANYL CITRATE PF 50 MCG/ML IJ SOSY
PREFILLED_SYRINGE | INTRAMUSCULAR | Status: AC
  Filled 2022-07-25: qty 1

## 2022-07-25 MED ORDER — ACETAMINOPHEN 10 MG/ML IV SOLN
INTRAVENOUS | Status: DC | PRN
  Administered 2022-07-25: 1000 mg via INTRAVENOUS

## 2022-07-25 MED ORDER — EPHEDRINE SULFATE (PRESSORS) 50 MG/ML IJ SOLN
INTRAMUSCULAR | Status: DC | PRN
  Administered 2022-07-25 (×4): 5 mg via INTRAVENOUS

## 2022-07-25 MED ORDER — DIPHENHYDRAMINE HCL 50 MG/ML IJ SOLN
12.5000 mg | Freq: Once | INTRAMUSCULAR | Status: AC
  Administered 2022-07-25: 12.5 mg via INTRAVENOUS

## 2022-07-25 MED ORDER — LIDOCAINE HCL (CARDIAC) PF 100 MG/5ML IV SOSY
PREFILLED_SYRINGE | INTRAVENOUS | Status: DC | PRN
  Administered 2022-07-25: 60 mg via INTRAVENOUS

## 2022-07-25 MED ORDER — HYDROCODONE-ACETAMINOPHEN 5-325 MG PO TABS: 1.0000 | ORAL_TABLET | ORAL | 0 refills | Status: DC | PRN

## 2022-07-25 MED ORDER — FENTANYL CITRATE (PF) 100 MCG/2ML IJ SOLN
INTRAMUSCULAR | Status: DC | PRN
  Administered 2022-07-25: 50 ug via INTRAVENOUS
  Administered 2022-07-25 (×2): 25 ug via INTRAVENOUS

## 2022-07-25 MED ORDER — DIPHENHYDRAMINE HCL 50 MG/ML IJ SOLN
INTRAMUSCULAR | Status: AC
  Filled 2022-07-25: qty 1

## 2022-07-25 MED ORDER — BUPIVACAINE-EPINEPHRINE (PF) 0.5% -1:200000 IJ SOLN
INTRAMUSCULAR | Status: DC | PRN
  Administered 2022-07-25: 30 mL

## 2022-07-25 MED ORDER — FAMOTIDINE 20 MG PO TABS: 20.0000 mg | ORAL_TABLET | Freq: Once | ORAL | Status: AC

## 2022-07-25 MED ORDER — CHLORHEXIDINE GLUCONATE 0.12 % MT SOLN
OROMUCOSAL | Status: AC
  Administered 2022-07-25: 15 mL via OROMUCOSAL
  Filled 2022-07-25: qty 15

## 2022-07-25 MED ORDER — OXYCODONE HCL 5 MG/5ML PO SOLN: 5.0000 mg | Freq: Once | ORAL | Status: DC | PRN

## 2022-07-25 MED ORDER — FAMOTIDINE 20 MG PO TABS
ORAL_TABLET | ORAL | Status: AC
  Administered 2022-07-25: 20 mg via ORAL
  Filled 2022-07-25: qty 1

## 2022-07-25 MED ORDER — OXYCODONE HCL 5 MG PO TABS: 5.0000 mg | ORAL_TABLET | Freq: Once | ORAL | Status: DC | PRN

## 2022-07-25 MED ORDER — DEXAMETHASONE SODIUM PHOSPHATE 10 MG/ML IJ SOLN
INTRAMUSCULAR | Status: DC | PRN
  Administered 2022-07-25: 10 mg via INTRAVENOUS

## 2022-07-25 MED ORDER — ORAL CARE MOUTH RINSE: 15.0000 mL | Freq: Once | OROMUCOSAL | Status: AC

## 2022-07-25 MED ORDER — PROPOFOL 10 MG/ML IV BOLUS
INTRAVENOUS | Status: DC | PRN
  Administered 2022-07-25: 100 mg via INTRAVENOUS
  Administered 2022-07-25 (×2): 50 mg via INTRAVENOUS

## 2022-07-25 MED ORDER — FENTANYL CITRATE (PF) 100 MCG/2ML IJ SOLN: 25.0000 ug | INTRAMUSCULAR | Status: DC | PRN

## 2022-07-25 MED ORDER — BUPIVACAINE-EPINEPHRINE (PF) 0.5% -1:200000 IJ SOLN
INTRAMUSCULAR | Status: AC
  Filled 2022-07-25: qty 30

## 2022-07-25 MED ORDER — GLYCOPYRROLATE 0.2 MG/ML IJ SOLN
INTRAMUSCULAR | Status: DC | PRN
  Administered 2022-07-25: .2 mg via INTRAVENOUS

## 2022-07-25 MED ORDER — CEFAZOLIN SODIUM-DEXTROSE 2-4 GM/100ML-% IV SOLN
INTRAVENOUS | Status: AC
  Filled 2022-07-25: qty 100

## 2022-07-25 SURGICAL SUPPLY — 36 items
APL PRP STRL LF DISP 70% ISPRP (MISCELLANEOUS) ×1
BLADE SURG 15 STRL SS SAFETY (BLADE) ×2 IMPLANT
CHLORAPREP W/TINT 26 (MISCELLANEOUS) ×1 IMPLANT
DRAIN PENROSE 12X.25 LTX STRL (MISCELLANEOUS) ×1 IMPLANT
DRAPE LAPAROTOMY 100X77 ABD (DRAPES) ×1 IMPLANT
DRSG TEGADERM 4X4.75 (GAUZE/BANDAGES/DRESSINGS) ×1 IMPLANT
DRSG TELFA 3X4 N-ADH STERILE (GAUZE/BANDAGES/DRESSINGS) ×1 IMPLANT
ELECT REM PT RETURN 9FT ADLT (ELECTROSURGICAL) ×1
ELECTRODE REM PT RTRN 9FT ADLT (ELECTROSURGICAL) ×1 IMPLANT
GAUZE 4X4 16PLY ~~LOC~~+RFID DBL (SPONGE) ×1 IMPLANT
GLOVE BIO SURGEON STRL SZ7.5 (GLOVE) ×1 IMPLANT
GLOVE SURG UNDER LTX SZ8 (GLOVE) ×1 IMPLANT
GOWN STRL REUS W/ TWL LRG LVL3 (GOWN DISPOSABLE) ×2 IMPLANT
GOWN STRL REUS W/TWL LRG LVL3 (GOWN DISPOSABLE) ×2
KIT TURNOVER KIT A (KITS) ×1 IMPLANT
LABEL OR SOLS (LABEL) ×1 IMPLANT
MANIFOLD NEPTUNE II (INSTRUMENTS) ×1 IMPLANT
MESH HERNIA 6X12 ULTRAPRO MED (Mesh General) IMPLANT
NEEDLE HYPO 22GX1.5 SAFETY (NEEDLE) ×2 IMPLANT
PACK BASIN MINOR ARMC (MISCELLANEOUS) ×1 IMPLANT
SPIKE FLUID TRANSFER (MISCELLANEOUS) ×1 IMPLANT
STRIP CLOSURE SKIN 1/2X4 (GAUZE/BANDAGES/DRESSINGS) ×1 IMPLANT
SUT PDS AB 0 CT1 27 (SUTURE) IMPLANT
SUT SURGILON 0 BLK (SUTURE) ×1 IMPLANT
SUT VIC AB 2-0 SH 27 (SUTURE) ×1
SUT VIC AB 2-0 SH 27XBRD (SUTURE) ×1 IMPLANT
SUT VIC AB 3-0 54X BRD REEL (SUTURE) ×1 IMPLANT
SUT VIC AB 3-0 BRD 54 (SUTURE) ×1
SUT VIC AB 3-0 SH 27 (SUTURE) ×1
SUT VIC AB 3-0 SH 27X BRD (SUTURE) ×1 IMPLANT
SUT VIC AB 4-0 FS2 27 (SUTURE) ×1 IMPLANT
SWABSTK COMLB BENZOIN TINCTURE (MISCELLANEOUS) ×1 IMPLANT
SYR 10ML LL (SYRINGE) ×1 IMPLANT
SYR 3ML LL SCALE MARK (SYRINGE) ×1 IMPLANT
TRAP FLUID SMOKE EVACUATOR (MISCELLANEOUS) ×1 IMPLANT
WATER STERILE IRR 500ML POUR (IV SOLUTION) ×1 IMPLANT

## 2022-07-25 NOTE — Anesthesia Procedure Notes (Signed)
Procedure Name: LMA Insertion Date/Time: 07/25/2022 7:40 AM  Performed by: Kelton Pillar, CRNAPre-anesthesia Checklist: Patient identified, Emergency Drugs available, Suction available and Patient being monitored Patient Re-evaluated:Patient Re-evaluated prior to induction Oxygen Delivery Method: Circle system utilized Preoxygenation: Pre-oxygenation with 100% oxygen Induction Type: IV induction Ventilation: Mask ventilation without difficulty LMA: LMA inserted LMA Size: 3.0 Placement Confirmation: positive ETCO2, CO2 detector and breath sounds checked- equal and bilateral Tube secured with: Tape Dental Injury: Teeth and Oropharynx as per pre-operative assessment

## 2022-07-25 NOTE — Progress Notes (Signed)
Patient lip looks a lot better, still swollen but is not worsening. Patient says that she feels better.

## 2022-07-25 NOTE — Progress Notes (Signed)
0910- patient c/o swollen lip and "lip feels funny'. It is significantly swollen, ice provided, no respiratory distress or trouble breathing. Notified Dr. Bertell Maria, per MD keep ice on and monitor. Lip continues to swell so this nurse asked Dr. Bertell Maria to come see patient. Benadryl ordered and given to patient.

## 2022-07-25 NOTE — H&P (Signed)
Melinda Barber [id number] April 03, 1956     HPI:  Healthy 66 y/o a symptomatic right inguinal hernia.   Medications Prior to Admission  Medication Sig Dispense Refill Last Dose   amLODipine (NORVASC) 10 MG tablet TAKE 1 TABLET BY MOUTH EVERY DAY (Patient taking differently: Take 10 mg by mouth every morning.) 90 tablet 3 07/25/2022   calcium carbonate (OSCAL) 1500 (600 Ca) MG TABS tablet Take 1,500 mg by mouth 2 (two) times daily with a meal.   07/24/2022   losartan-hydrochlorothiazide (HYZAAR) 100-25 MG tablet TAKE 1 TABLET BY MOUTH DAILY (Patient taking differently: Take 1 tablet by mouth every evening.) 90 tablet 2 07/24/2022   metoprolol succinate (TOPROL-XL) 25 MG 24 hr tablet TAKE 1 TABLET BY MOUTH EVERY DAY (Patient taking differently: 25 mg every evening. TAKE 1 TABLET BY MOUTH EVERY DAY) 90 tablet 3 07/24/2022 at 1630   Multiple Vitamin (MULTIVITAMIN) capsule Take 1 capsule by mouth daily.   07/18/2022   No Known Allergies Past Medical History:  Diagnosis Date   Carotid bruit    Complication of anesthesia    with 1st colonoscopy in [place] was "given too much anesthesia" and was loopy and had stomach issues for days-2nd colonoscopy no problems   Elevated hemoglobin (HCC)    GERD (gastroesophageal reflux disease)    occ no meds   Hyperbilirubinemia    Hypercholesterolemia    Hypertension    Inguinal hernia    Palpitations    Tremor    Past Surgical History:  Procedure Laterality Date   COLONOSCOPY     Social History   Socioeconomic History   Marital status: Married    Spouse name: Not on file   Number of children: 3   Years of education: Not on file   Highest education level: Not on file  Occupational History   Not on file  Tobacco Use   Smoking status: Former    Packs/day: 0.50    Years: 12.00    Total pack years: 6.00    Types: Cigarettes    Quit date: 09/20/1991    Years since quitting: 30.8   Smokeless tobacco: Never  Vaping Use   Vaping Use: Never used   Substance and Sexual Activity   Alcohol use: Yes    Comment: occ wine   Drug use: No   Sexual activity: Not on file  Other Topics Concern   Not on file  Social History Narrative   Not on file   Social Determinants of Health   Financial Resource Strain: Not on file  Food Insecurity: Not on file  Transportation Needs: Not on file  Physical Activity: Not on file  Stress: Not on file  Social Connections: Not on file  Intimate Partner Violence: Not on file   Social History   Social History Narrative   Not on file     ROS: Negative.     PE: HEENT: Negative. Lungs: Clear. Cardio: RR.   Assessment/Plan:  Proceed with planned right inguinal hernia repair.  Forest Gleason [facility] 07/25/2022

## 2022-07-25 NOTE — Discharge Instructions (Addendum)
AMBULATORY SURGERY  DISCHARGE INSTRUCTIONS   The drugs that you were given will stay in your system until tomorrow so for the next 24 hours you should not:  Drive an automobile Make any legal decisions Drink any alcoholic beverage   You may resume regular meals tomorrow.  Today it is better to start with liquids and gradually work up to solid foods.  You may eat anything you prefer, but it is better to start with liquids, then soup and crackers, and gradually work up to solid foods.   Please notify your doctor immediately if you have any unusual bleeding, trouble breathing, redness and pain at the surgery site, drainage, fever, or pain not relieved by medication.    Additional Instructions: Benadryl given at 0915 am 12.5 mg IV   Please contact your physician with any problems or Same Day Surgery at [phone], Monday through Friday 6 am to 4 pm, or [place] at [facility] number at [phone].

## 2022-07-25 NOTE — Anesthesia Preprocedure Evaluation (Signed)
Anesthesia Evaluation  Patient identified by MRN, date of birth, ID band Patient awake    Reviewed: Allergy & Precautions, NPO status , Patient's Chart, lab work & pertinent test results  History of Anesthesia Complications (+) PROLONGED EMERGENCE and history of anesthetic complications  Airway Mallampati: II  TM Distance: >3 FB Neck ROM: Full    Dental no notable dental hx. (+) Teeth Intact   Pulmonary neg pulmonary ROS, neg sleep apnea, neg COPD, Patient abstained from smoking.Not current smoker, former smoker   Pulmonary exam normal breath sounds clear to auscultation       Cardiovascular Exercise Tolerance: Good METShypertension, Pt. on medications (-) CAD and (-) Past MI (-) dysrhythmias  Rhythm:Regular Rate:Normal - Systolic murmurs    Neuro/Psych  Headaches  negative psych ROS   GI/Hepatic ,GERD  Controlled,,(+)     (-) substance abuse    Endo/Other  neg diabetes    Renal/GU negative Renal ROS     Musculoskeletal   Abdominal   Peds  Hematology   Anesthesia Other Findings Past Medical History: No date: Carotid bruit No date: Complication of anesthesia     Comment:  with 1st colonoscopy in [place] was "given too much               anesthesia" and was loopy and had stomach issues for               days-2nd colonoscopy no problems No date: Elevated hemoglobin (HCC) No date: GERD (gastroesophageal reflux disease)     Comment:  occ no meds No date: Hyperbilirubinemia No date: Hypercholesterolemia No date: Hypertension No date: Inguinal hernia No date: Palpitations No date: Tremor  Reproductive/Obstetrics                             Anesthesia Physical Anesthesia Plan  ASA: 2  Anesthesia Plan: General   Post-op Pain Management: Toradol IV (intra-op)* and Ofirmev IV (intra-op)*   Induction: Intravenous  PONV Risk Score and Plan: 3 and Ondansetron and  Dexamethasone  Airway Management Planned: LMA  Additional Equipment: None  Intra-op Plan:   Post-operative Plan: Extubation in OR  Informed Consent: I have reviewed the patients History and Physical, chart, labs and discussed the procedure including the risks, benefits and alternatives for the proposed anesthesia with the patient or authorized representative who has indicated his/her understanding and acceptance.     Dental advisory given  Plan Discussed with: CRNA and Surgeon  Anesthesia Plan Comments: (Discussed risks of anesthesia with patient, including PONV, sore throat, lip/dental/eye damage. Rare risks discussed as well, such as cardiorespiratory and neurological sequelae, and allergic reactions. Discussed the role of CRNA in patient's perioperative care. Patient understands.)       Anesthesia Quick Evaluation

## 2022-07-25 NOTE — Anesthesia Postprocedure Evaluation (Signed)
Anesthesia Post Note  Patient: Melinda Barber  Procedure(s) Performed: HERNIA REPAIR INGUINAL ADULT (Right: Inguinal) INSERTION OF MESH (Right: Inguinal)  Patient location during evaluation: PACU Anesthesia Type: General Level of consciousness: awake and alert Pain management: pain level controlled Vital Signs Assessment: post-procedure vital signs reviewed and stable Respiratory status: spontaneous breathing, nonlabored ventilation, respiratory function stable and patient connected to nasal cannula oxygen Cardiovascular status: blood pressure returned to baseline and stable Postop Assessment: no apparent nausea or vomiting Anesthetic complications: no Comments: Patient with swollen lower lip, no clear evidence of trauma (LMA placement was atraumatic as well). Patient says her lower lip sometimes swells, and she is unable to pinpoint any trigger. She takes benadryl when this happens. She denies any throat itching or wheezing or respiratory distress. Benadryl provided.   No notable events documented.   Last Vitals:  Vitals:   07/25/22 0623 07/25/22 0848  BP: 130/81 117/70  Pulse: (!) 57 70  Resp: 16 20  Temp: (!) 36.3 C 36.8 C  SpO2: 99% 98%    Last Pain:  Vitals:   07/25/22 0848  TempSrc:   PainSc: 3                  Arita Miss

## 2022-07-25 NOTE — Op Note (Signed)
Preoperative diagnosis: Right inguinal hernia.  Postoperative diagnosis: Same with inguinal lipoma.  Operative procedure: Right indirect inguinal hernia repair with excision of lipoma, placement of medium Ultra Pro mesh.  Operating surgeon: Hervey Ard, MD.  Anesthesia: General by LMA, Marcaine 0.5% with 1: 200,000 units of epinephrine, 30 cc; Toradol: 30 mg.  Estimated blood loss: 2 cc.  Clinical note: This 66 year old woman has developed a symptomatic right inguinal hernia.  Clinical exam is consistent with bowel within the hernia contents.  She was mated for elective repair.  SCD stockings for DVT prevention.  She received Ancef on induction of anesthesia.  Hair was removed from the surgical site with clippers in the operating theater.  Operative note: The patient underwent general anesthesia and tolerated this well.  The area was cleansed with ChloraPrep and draped.  Field block anesthesia was established with the above-mentioned local anesthetic.  A 5 cm skin line incision along the anticipated course the inguinal canal was carried down to the skin and subtendinous tissue with hemostasis achieved electrocautery and 3-0 Vicryl ties.  The external oblique was opened in the direction of its fibers.  The iliohypogastric nerve was identified but the ilioinguinal nerve was not.  A sizable indirect sac was identified.  There was also a fairly sizable 2 x 4 cm lipoma.  The latter was excised with hemostasis by 3-0 Vicryl tie.  The round ligament was divided.  The hernia sac was dissected free into the preperitoneal space.  A medium Ultra Pro mesh was smoothed into the preperitoneal space.  The external component was laid along the floor the inguinal canal over the dilated internal ring.  This was anchored to the pubic tubercle with interrupted 0 Surgilon sutures x2 and then along the inguinal ligament with interrupted 0 Surgilon sutures.  The superior medial borders were anchored to the transverse  abdominis aponeurosis with similar sutures.  The lateral portion of the mass was free-floating.  Toradol was placed in the wound.  The external oblique was closed with a running 2-0 Vicryl suture.  Scarpa's fascia was closed with a running 3-0 Vicryl suture.  The skin was closed with a running 4-0 Vicryl subcuticular suture.  Benzoin, Steri-Strips, Telfa and Tegaderm dressing was applied.  Patient tolerated procedure well and was taken to the PACU in stable condition.

## 2022-07-25 NOTE — Transfer of Care (Signed)
Immediate Anesthesia Transfer of Care Note  Patient: Melinda Barber  Procedure(s) Performed: HERNIA REPAIR INGUINAL ADULT (Right: Inguinal) INSERTION OF MESH (Right: Inguinal)  Patient Location: PACU  Anesthesia Type:General  Level of Consciousness: awake, drowsy, and patient cooperative  Airway & Oxygen Therapy: Patient Spontanous Breathing  Post-op Assessment: Report given to RN and Post -op Vital signs reviewed and stable  Post vital signs: Reviewed and stable  Last Vitals:  Vitals Value Taken Time  BP    Temp    Pulse    Resp    SpO2      Last Pain:  Vitals:   07/25/22 0623  TempSrc: Oral  PainSc: 0-No pain         Complications: No notable events documented.

## 2022-07-26 ENCOUNTER — Encounter: Payer: Self-pay | Admitting: General Surgery

## 2022-08-03 ENCOUNTER — Ambulatory Visit: Payer: BC Managed Care – PPO | Admitting: Internal Medicine

## 2022-08-03 ENCOUNTER — Encounter: Payer: Self-pay | Admitting: Internal Medicine

## 2022-08-03 VITALS — BP 110/68 | HR 63 | Temp 97.9°F | Resp 16 | Ht 62.0 in | Wt 138.0 lb

## 2022-08-03 DIAGNOSIS — I10 Essential (primary) hypertension: Secondary | ICD-10-CM

## 2022-08-03 DIAGNOSIS — K4090 Unilateral inguinal hernia, without obstruction or gangrene, not specified as recurrent: Secondary | ICD-10-CM

## 2022-08-03 DIAGNOSIS — E7800 Pure hypercholesterolemia, unspecified: Secondary | ICD-10-CM

## 2022-08-03 DIAGNOSIS — F439 Reaction to severe stress, unspecified: Secondary | ICD-10-CM

## 2022-08-03 LAB — HEPATIC FUNCTION PANEL
ALT: 26 U/L (ref 0–35)
AST: 24 U/L (ref 0–37)
Albumin: 4.4 g/dL (ref 3.5–5.2)
Alkaline Phosphatase: 94 U/L (ref 39–117)
Bilirubin, Direct: 0.2 mg/dL (ref 0.0–0.3)
Total Bilirubin: 1 mg/dL (ref 0.2–1.2)
Total Protein: 6.8 g/dL (ref 6.0–8.3)

## 2022-08-03 LAB — LIPID PANEL
Cholesterol: 187 mg/dL (ref 0–200)
HDL: 60.2 mg/dL (ref >39.00)
LDL Cholesterol: 109 mg/dL — ABNORMAL HIGH (ref 0–99)
NonHDL: 127.26
Total CHOL/HDL Ratio: 3
Triglycerides: 89 mg/dL (ref 0.0–149.0)
VLDL: 17.8 mg/dL (ref 0.0–40.0)

## 2022-08-03 LAB — BASIC METABOLIC PANEL
BUN: 18 mg/dL (ref 6–23)
CO2: 33 mEq/L — ABNORMAL HIGH (ref 19–32)
Calcium: 9.4 mg/dL (ref 8.4–10.5)
Chloride: 98 mEq/L (ref 96–112)
Creatinine, Ser: 0.74 mg/dL (ref 0.40–1.20)
GFR: 85.59 mL/min (ref >60.00)
Glucose, Bld: 94 mg/dL (ref 70–99)
Potassium: 3.5 mEq/L (ref 3.5–5.1)
Sodium: 135 mEq/L (ref 135–145)

## 2022-08-03 MED ORDER — LOSARTAN POTASSIUM-HCTZ 100-25 MG PO TABS: 1.0000 | ORAL_TABLET | Freq: Every day | ORAL | 2 refills | Status: DC

## 2022-08-03 NOTE — Progress Notes (Signed)
Patient ID: Melinda Barber, female   DOB: [birth year], 66 y.o.   MRN: [id number]   Subjective:    Patient ID: Melinda Barber, female    DOB: 1956-08-06, 66 y.o.   MRN: [id number]   Patient here for  Chief Complaint  Patient presents with   Follow-up   Hypertension   Hyperlipidemia   .   HPI Here to follow up regarding blood pressure and cholesterol.  S/p recent inguinal hernia repair - 07/25/22.  Still sore.  Taking tylenol.  Has f/u planned with Dr Lemar Livings tomorrow.  Reports no chest pain or sob.  Eating.  No bowel issues reported.     Past Medical History:  Diagnosis Date   Carotid bruit    Complication of anesthesia    with 1st colonoscopy in [place] was "given too much anesthesia" and was loopy and had stomach issues for days-2nd colonoscopy no problems   Elevated hemoglobin (HCC)    GERD (gastroesophageal reflux disease)    occ no meds   Hyperbilirubinemia    Hypercholesterolemia    Hypertension    Inguinal hernia    Palpitations    Tremor    Past Surgical History:  Procedure Laterality Date   COLONOSCOPY     INGUINAL HERNIA REPAIR Right 07/25/2022   Procedure: HERNIA REPAIR INGUINAL ADULT;  Surgeon: Earline Mayotte, MD;  Location: [facility];  Service: General;  Laterality: Right;   INSERTION OF MESH Right 07/25/2022   Procedure: INSERTION OF MESH;  Surgeon: Earline Mayotte, MD;  Location: [facility];  Service: General;  Laterality: Right;   Family History  Problem Relation Age of Onset   Breast cancer Other 31   Hypertension Mother    Hypercholesterolemia Mother    Diabetes Mother    Transient ischemic attack Mother    Aneurysm Father    Hypertension Brother    Aneurysm Brother        heart   Colon cancer Neg Hx    Social History   Socioeconomic History   Marital status: Married    Spouse name: Not on file   Number of children: 3   Years of education: Not on file   Highest education level: Not on file  Occupational History   Not on file   Tobacco Use   Smoking status: Former    Packs/day: 0.50    Years: 12.00    Total pack years: 6.00    Types: Cigarettes    Quit date: 09/20/1991    Years since quitting: 30.9   Smokeless tobacco: Never  Vaping Use   Vaping Use: Never used  Substance and Sexual Activity   Alcohol use: Yes    Comment: occ wine   Drug use: No   Sexual activity: Not on file  Other Topics Concern   Not on file  Social History Narrative   Not on file   Social Determinants of Health   Financial Resource Strain: Not on file  Food Insecurity: Not on file  Transportation Needs: Not on file  Physical Activity: Not on file  Stress: Not on file  Social Connections: Not on file     Review of Systems  Constitutional:  Negative for appetite change and unexpected weight change.  HENT:  Negative for congestion and sinus pressure.   Respiratory:  Negative for cough, chest tightness and shortness of breath.   Cardiovascular:  Negative for chest pain, palpitations and leg swelling.  Gastrointestinal:  Negative for abdominal pain, diarrhea, nausea and vomiting.  Genitourinary:  Negative for difficulty urinating and dysuria.  Musculoskeletal:  Negative for joint swelling and myalgias.  Skin:  Negative for color change and rash.  Neurological:  Negative for dizziness and headaches.  Psychiatric/Behavioral:  Negative for agitation and dysphoric mood.        Objective:     BP 110/68 (BP Location: Left Arm, Patient Position: Sitting, Cuff Size: Small)   Pulse 63   Temp 97.9 F (36.6 C) (Temporal)   Resp 16   Ht 5\' 2"  (1.575 m)   Wt 138 lb (62.6 kg)   LMP 06/28/2006   SpO2 98%   BMI 25.24 kg/m  Wt Readings from Last 3 Encounters:  08/03/22 138 lb (62.6 kg)  07/25/22 136 lb 0.4 oz (61.7 kg)  07/21/22 136 lb 0.4 oz (61.7 kg)    Physical Exam Vitals reviewed.  Constitutional:      General: She is not in acute distress.    Appearance: Normal appearance.  HENT:     Head: Normocephalic and  atraumatic.     Right Ear: External ear normal.     Left Ear: External ear normal.  Eyes:     General: No scleral icterus.       Right eye: No discharge.        Left eye: No discharge.     Conjunctiva/sclera: Conjunctivae normal.  Neck:     Thyroid: No thyromegaly.  Cardiovascular:     Rate and Rhythm: Normal rate and regular rhythm.  Pulmonary:     Effort: No respiratory distress.     Breath sounds: Normal breath sounds. No wheezing.  Abdominal:     General: Bowel sounds are normal.     Palpations: Abdomen is soft.     Tenderness: There is no abdominal tenderness.  Musculoskeletal:        General: No swelling or tenderness.     Cervical back: Neck supple. No tenderness.  Lymphadenopathy:     Cervical: No cervical adenopathy.  Skin:    Findings: No erythema or rash.  Neurological:     Mental Status: She is alert.  Psychiatric:        Mood and Affect: Mood normal.        Behavior: Behavior normal.      Outpatient Encounter Medications as of 08/03/2022  Medication Sig   amLODipine (NORVASC) 10 MG tablet TAKE 1 TABLET BY MOUTH EVERY DAY (Patient taking differently: Take 10 mg by mouth every morning.)   calcium carbonate (OSCAL) 1500 (600 Ca) MG TABS tablet Take 1,500 mg by mouth 2 (two) times daily with a meal.   metoprolol succinate (TOPROL-XL) 25 MG 24 hr tablet TAKE 1 TABLET BY MOUTH EVERY DAY (Patient taking differently: 25 mg every evening. TAKE 1 TABLET BY MOUTH EVERY DAY)   Multiple Vitamin (MULTIVITAMIN) capsule Take 1 capsule by mouth daily.   [DISCONTINUED] HYDROcodone-acetaminophen (NORCO/VICODIN) 5-325 MG tablet Take 1 tablet by mouth every 4 (four) hours as needed for moderate pain.   [DISCONTINUED] losartan-hydrochlorothiazide (HYZAAR) 100-25 MG tablet TAKE 1 TABLET BY MOUTH DAILY (Patient taking differently: Take 1 tablet by mouth every evening.)   losartan-hydrochlorothiazide (HYZAAR) 100-25 MG tablet Take 1 tablet by mouth daily.   No facility-administered  encounter medications on file as of 08/03/2022.     Lab Results  Component Value Date   WBC 4.1 03/21/2022   HGB 14.6 07/25/2022   HCT 43.0 07/25/2022   PLT 196.0 03/21/2022   GLUCOSE 94 08/03/2022   CHOL 187 08/03/2022  TRIG 89.0 08/03/2022   HDL 60.20 08/03/2022   LDLDIRECT 112.0 06/14/2018   LDLCALC 109 (H) 08/03/2022   ALT 26 08/03/2022   AST 24 08/03/2022   NA 135 08/03/2022   K 3.5 08/03/2022   CL 98 08/03/2022   CREATININE 0.74 08/03/2022   BUN 18 08/03/2022   CO2 33 (H) 08/03/2022   TSH 1.42 03/21/2022   HGBA1C 5.3 03/27/2019       Assessment & Plan:   Problem List Items Addressed This Visit     Hypercholesterolemia    The 10-year ASCVD risk score (Arnett DK, et al., 2019) is: 7.2%   Values used to calculate the score:     Age: 63 years     Sex: Female     Is Non-Hispanic African American: No     Diabetic: No     Tobacco smoker: No     Systolic Blood Pressure: 138 mmHg     Is BP treated: Yes     HDL Cholesterol: 60.2 mg/dL     Total Cholesterol: 187 mg/dL  Low cholesterol diet and exercise.  Follow lipid panel.       Relevant Medications   losartan-hydrochlorothiazide (HYZAAR) 100-25 MG tablet   Hypertension    Blood pressure as outlined.  On amlodipine 10mg  q day and losartan/hctz.  Follow pressures.  Follow metabolic panel       Relevant Medications   losartan-hydrochlorothiazide (HYZAAR) 100-25 MG tablet   Inguinal hernia - Primary    Right indirect inguinal hernia repair with excision of lipoma 07/25/22 (Dr 13/6/23).  Has f/u tomorrow.  Still some soreness.  Taking tylenol.       Stress    Overall feels she is handling things relatively well.  Follow.         Lemar Livings, MD

## 2022-08-14 ENCOUNTER — Encounter: Payer: Self-pay | Admitting: Internal Medicine

## 2022-08-14 NOTE — Assessment & Plan Note (Addendum)
Overall feels she is handling things relatively well.  Follow.   

## 2022-08-14 NOTE — Assessment & Plan Note (Signed)
Right indirect inguinal hernia repair with excision of lipoma 07/25/22 (Dr Lemar Livings).  Has f/u tomorrow.  Still some soreness.  Taking tylenol.

## 2022-08-14 NOTE — Assessment & Plan Note (Signed)
Blood pressure as outlined.  On amlodipine 10mg  q day and losartan/hctz.  Follow pressures.  Follow metabolic panel

## 2022-08-14 NOTE — Assessment & Plan Note (Signed)
The 10-year ASCVD risk score (Arnett DK, et al., 2019) is: 7.2%   Values used to calculate the score:     Age: 66 years     Sex: Female     Is Non-Hispanic African American: No     Diabetic: No     Tobacco smoker: No     Systolic Blood Pressure: 138 mmHg     Is BP treated: Yes     HDL Cholesterol: 60.2 mg/dL     Total Cholesterol: 187 mg/dL  Low cholesterol diet and exercise.  Follow lipid panel.

## 2023-01-04 ENCOUNTER — Other Ambulatory Visit: Payer: Self-pay | Admitting: Internal Medicine

## 2023-03-13 ENCOUNTER — Other Ambulatory Visit: Payer: Self-pay | Admitting: Internal Medicine

## 2023-03-28 ENCOUNTER — Encounter: Payer: Self-pay | Admitting: Internal Medicine

## 2023-03-28 ENCOUNTER — Ambulatory Visit (INDEPENDENT_AMBULATORY_CARE_PROVIDER_SITE_OTHER): Payer: BC Managed Care – PPO | Admitting: Internal Medicine

## 2023-03-28 ENCOUNTER — Ambulatory Visit (INDEPENDENT_AMBULATORY_CARE_PROVIDER_SITE_OTHER): Payer: BC Managed Care – PPO

## 2023-03-28 VITALS — BP 120/70 | HR 60 | Temp 97.9°F | Resp 16 | Ht 62.0 in | Wt 138.0 lb

## 2023-03-28 DIAGNOSIS — Z1159 Encounter for screening for other viral diseases: Secondary | ICD-10-CM

## 2023-03-28 DIAGNOSIS — Z1231 Encounter for screening mammogram for malignant neoplasm of breast: Secondary | ICD-10-CM

## 2023-03-28 DIAGNOSIS — M542 Cervicalgia: Secondary | ICD-10-CM | POA: Diagnosis not present

## 2023-03-28 DIAGNOSIS — K4090 Unilateral inguinal hernia, without obstruction or gangrene, not specified as recurrent: Secondary | ICD-10-CM

## 2023-03-28 DIAGNOSIS — M25551 Pain in right hip: Secondary | ICD-10-CM

## 2023-03-28 DIAGNOSIS — Z0001 Encounter for general adult medical examination with abnormal findings: Secondary | ICD-10-CM | POA: Diagnosis not present

## 2023-03-28 DIAGNOSIS — E7800 Pure hypercholesterolemia, unspecified: Secondary | ICD-10-CM

## 2023-03-28 DIAGNOSIS — Z0000 Encounter for general adult medical examination without abnormal findings: Secondary | ICD-10-CM

## 2023-03-28 DIAGNOSIS — I10 Essential (primary) hypertension: Secondary | ICD-10-CM | POA: Diagnosis not present

## 2023-03-28 DIAGNOSIS — R251 Tremor, unspecified: Secondary | ICD-10-CM | POA: Diagnosis not present

## 2023-03-28 DIAGNOSIS — Z114 Encounter for screening for human immunodeficiency virus [HIV]: Secondary | ICD-10-CM

## 2023-03-28 LAB — CBC WITH DIFFERENTIAL/PLATELET
Basophils Absolute: 0 10*3/uL (ref 0.0–0.1)
Basophils Relative: 0.6 % (ref 0.0–3.0)
Eosinophils Absolute: 0.1 10*3/uL (ref 0.0–0.7)
Eosinophils Relative: 2.2 % (ref 0.0–5.0)
HCT: 43.7 % (ref 36.0–46.0)
Hemoglobin: 14.5 g/dL (ref 12.0–15.0)
Lymphocytes Relative: 34.5 % (ref 12.0–46.0)
Lymphs Abs: 1.6 10*3/uL (ref 0.7–4.0)
MCHC: 33.1 g/dL (ref 30.0–36.0)
MCV: 96.1 fl (ref 78.0–100.0)
Monocytes Absolute: 0.4 10*3/uL (ref 0.1–1.0)
Monocytes Relative: 9.3 % (ref 3.0–12.0)
Neutro Abs: 2.5 10*3/uL (ref 1.4–7.7)
Neutrophils Relative %: 53.4 % (ref 43.0–77.0)
Platelets: 207 10*3/uL (ref 150.0–400.0)
RBC: 4.55 Mil/uL (ref 3.87–5.11)
RDW: 12.7 % (ref 11.5–15.5)
WBC: 4.7 10*3/uL (ref 4.0–10.5)

## 2023-03-28 LAB — BASIC METABOLIC PANEL
BUN: 16 mg/dL (ref 6–23)
CO2: 31 mEq/L (ref 19–32)
Calcium: 10 mg/dL (ref 8.4–10.5)
Chloride: 102 mEq/L (ref 96–112)
Creatinine, Ser: 0.77 mg/dL (ref 0.40–1.20)
GFR: 81.23 mL/min (ref >60.00)
Glucose, Bld: 92 mg/dL (ref 70–99)
Potassium: 4 mEq/L (ref 3.5–5.1)
Sodium: 140 mEq/L (ref 135–145)

## 2023-03-28 LAB — HEPATIC FUNCTION PANEL
ALT: 21 U/L (ref 0–35)
AST: 21 U/L (ref 0–37)
Albumin: 4.5 g/dL (ref 3.5–5.2)
Alkaline Phosphatase: 81 U/L (ref 39–117)
Bilirubin, Direct: 0.2 mg/dL (ref 0.0–0.3)
Total Bilirubin: 1.2 mg/dL (ref 0.2–1.2)
Total Protein: 7.1 g/dL (ref 6.0–8.3)

## 2023-03-28 LAB — TSH: TSH: 1.19 u[IU]/mL (ref 0.35–5.50)

## 2023-03-28 LAB — LIPID PANEL
Cholesterol: 199 mg/dL (ref 0–200)
HDL: 60.2 mg/dL (ref >39.00)
LDL Cholesterol: 115 mg/dL — ABNORMAL HIGH (ref 0–99)
NonHDL: 138.93
Total CHOL/HDL Ratio: 3
Triglycerides: 119 mg/dL (ref 0.0–149.0)
VLDL: 23.8 mg/dL (ref 0.0–40.0)

## 2023-03-28 NOTE — Assessment & Plan Note (Signed)
Mild head tremor noted on exam.  FTN intact.  Gait wnl.  No other neurological changes noted on exam. Discussed further evaluation.  She wants to monitor at this time.  Follow.

## 2023-03-28 NOTE — Assessment & Plan Note (Signed)
Right indirect inguinal hernia repair with excision of lipoma 07/25/22 (Dr Lemar Livings).  Doing well.

## 2023-03-28 NOTE — Assessment & Plan Note (Signed)
Occasional pain. Better when up and moving /exercising.  Wants to monitor.

## 2023-03-28 NOTE — Progress Notes (Signed)
Subjective:    Patient ID: Melinda Barber, female    DOB: December 25, 1955, 67 y.o.   MRN: [id number]  Patient here for  Chief Complaint  Patient presents with   Annual Exam    HPI Here for a physical exam.  S/p inguinal hernia repair 07/25/22.  Has done well s/p surgery.  No significant pain. Stays active.  No chest pain or sob reported.  No cough or congestion.  Does report neck pain.  Has been a persistent problem for her, but more of a problem lately.  Neck aggravated when looking up.  Also increased head tremor.  Has family history of tremors.  No abdominal pain or bowel change reported.  Some occasional right hip pain.  Does not limit her activity.  Better if moving/exercising.     Past Medical History:  Diagnosis Date   Carotid bruit    Complication of anesthesia    with 1st colonoscopy in [place] was "given too much anesthesia" and was loopy and had stomach issues for days-2nd colonoscopy no problems   Elevated hemoglobin (HCC)    GERD (gastroesophageal reflux disease)    occ no meds   Hyperbilirubinemia    Hypercholesterolemia    Hypertension    Inguinal hernia    Palpitations    Tremor    Past Surgical History:  Procedure Laterality Date   COLONOSCOPY     INGUINAL HERNIA REPAIR Right 07/25/2022   Procedure: HERNIA REPAIR INGUINAL ADULT;  Surgeon: Earline Mayotte, MD;  Location: [facility];  Service: General;  Laterality: Right;   INSERTION OF MESH Right 07/25/2022   Procedure: INSERTION OF MESH;  Surgeon: Earline Mayotte, MD;  Location: [facility];  Service: General;  Laterality: Right;   Family History  Problem Relation Age of Onset   Breast cancer Other 40   Hypertension Mother    Hypercholesterolemia Mother    Diabetes Mother    Transient ischemic attack Mother    Aneurysm Father    Hypertension Brother    Aneurysm Brother        heart   Colon cancer Neg Hx    Social History   Socioeconomic History   Marital status: Married    Spouse name: Not on  file   Number of children: 3   Years of education: Not on file   Highest education level: Not on file  Occupational History   Not on file  Tobacco Use   Smoking status: Former    Packs/day: 0.50    Years: 12.00    Additional pack years: 0.00    Total pack years: 6.00    Types: Cigarettes    Quit date: 09/20/1991    Years since quitting: 31.5   Smokeless tobacco: Never  Vaping Use   Vaping Use: Never used  Substance and Sexual Activity   Alcohol use: Yes    Comment: occ wine   Drug use: No   Sexual activity: Not on file  Other Topics Concern   Not on file  Social History Narrative   Not on file   Social Determinants of Health   Financial Resource Strain: Not on file  Food Insecurity: Not on file  Transportation Needs: Not on file  Physical Activity: Not on file  Stress: Not on file  Social Connections: Not on file     Review of Systems  Constitutional:  Negative for appetite change and unexpected weight change.  HENT:  Negative for congestion, sinus pressure and sore throat.  Eyes:  Negative for pain and visual disturbance.  Respiratory:  Negative for cough, chest tightness and shortness of breath.   Cardiovascular:  Negative for chest pain, palpitations and leg swelling.  Gastrointestinal:  Negative for abdominal pain, diarrhea, nausea and vomiting.  Genitourinary:  Negative for difficulty urinating and dysuria.  Musculoskeletal:  Positive for neck pain. Negative for joint swelling and myalgias.  Skin:  Negative for color change and rash.  Neurological:  Negative for dizziness and headaches.  Hematological:  Negative for adenopathy. Does not bruise/bleed easily.  Psychiatric/Behavioral:  Negative for agitation and dysphoric mood.        Objective:     BP 120/70   Pulse 60   Temp 97.9 F (36.6 C)   Resp 16   Ht 5\' 2"  (1.575 m)   Wt 138 lb (62.6 kg)   LMP 06/28/2006   SpO2 99%   BMI 25.24 kg/m  Wt Readings from Last 3 Encounters:  03/28/23 138 lb  (62.6 kg)  08/03/22 138 lb (62.6 kg)  07/25/22 136 lb 0.4 oz (61.7 kg)    Physical Exam Vitals reviewed.  Constitutional:      General: She is not in acute distress.    Appearance: Normal appearance. She is well-developed.  HENT:     Head: Normocephalic and atraumatic.     Right Ear: External ear normal.     Left Ear: External ear normal.     Mouth/Throat:     Pharynx: No oropharyngeal exudate or posterior oropharyngeal erythema.  Eyes:     General: No scleral icterus.       Right eye: No discharge.        Left eye: No discharge.     Conjunctiva/sclera: Conjunctivae normal.  Neck:     Thyroid: No thyromegaly.  Cardiovascular:     Rate and Rhythm: Normal rate and regular rhythm.  Pulmonary:     Effort: No tachypnea, accessory muscle usage or respiratory distress.     Breath sounds: Normal breath sounds. No decreased breath sounds or wheezing.  Chest:  Breasts:    Right: No inverted nipple, mass, nipple discharge or tenderness (no axillary adenopathy).     Left: No inverted nipple, mass, nipple discharge or tenderness (no axilarry adenopathy).  Abdominal:     General: Bowel sounds are normal.     Palpations: Abdomen is soft.     Tenderness: There is no abdominal tenderness.  Musculoskeletal:        General: No swelling or tenderness.     Cervical back: Neck supple.     Comments: Increased pain - looking up.  Some increased tenderness to palpation - posterior lateral neck.   Lymphadenopathy:     Cervical: No cervical adenopathy.  Skin:    Findings: No erythema or rash.  Neurological:     Mental Status: She is alert and oriented to person, place, and time.  Psychiatric:        Mood and Affect: Mood normal.        Behavior: Behavior normal.      Outpatient Encounter Medications as of 03/28/2023  Medication Sig   amLODipine (NORVASC) 10 MG tablet TAKE 1 TABLET BY MOUTH EVERY DAY   calcium carbonate (OSCAL) 1500 (600 Ca) MG TABS tablet Take 1,500 mg by mouth 2 (two)  times daily with a meal.   losartan-hydrochlorothiazide (HYZAAR) 100-25 MG tablet TAKE 1 TABLET BY MOUTH EVERY DAY   metoprolol succinate (TOPROL-XL) 25 MG 24 hr tablet TAKE 1 TABLET BY  MOUTH EVERY DAY (Patient taking differently: 25 mg every evening. TAKE 1 TABLET BY MOUTH EVERY DAY)   Multiple Vitamin (MULTIVITAMIN) capsule Take 1 capsule by mouth daily.   No facility-administered encounter medications on file as of 03/28/2023.     Lab Results  Component Value Date   WBC 4.1 03/21/2022   HGB 14.6 07/25/2022   HCT 43.0 07/25/2022   PLT 196.0 03/21/2022   GLUCOSE 94 08/03/2022   CHOL 187 08/03/2022   TRIG 89.0 08/03/2022   HDL 60.20 08/03/2022   LDLDIRECT 112.0 06/14/2018   LDLCALC 109 (H) 08/03/2022   ALT 26 08/03/2022   AST 24 08/03/2022   NA 135 08/03/2022   K 3.5 08/03/2022   CL 98 08/03/2022   CREATININE 0.74 08/03/2022   BUN 18 08/03/2022   CO2 33 (H) 08/03/2022   TSH 1.42 03/21/2022   HGBA1C 5.3 03/27/2019       Assessment & Plan:  Routine general medical examination at a health care facility  Hypercholesterolemia Assessment & Plan: The 10-year ASCVD risk score (Arnett DK, et al., 2019) is: 5.5%   Values used to calculate the score:     Age: 67 years     Sex: Female     Is Non-Hispanic African American: No     Diabetic: No     Tobacco smoker: No     Systolic Blood Pressure: 120 mmHg     Is BP treated: Yes     HDL Cholesterol: 60.2 mg/dL     Total Cholesterol: 187 mg/dL  Low cholesterol diet and exercise.  Follow lipid panel.   Orders: -     CBC with Differential/Platelet -     TSH -     Hepatic function panel -     Lipid panel  Primary hypertension Assessment & Plan: Blood pressure as outlined.  Doing well. On amlodipine 10mg  q day and losartan/hctz.  Follow pressures.  Follow metabolic panel   Orders: -     Basic metabolic panel  Health care maintenance Assessment & Plan: Physical today 03/28/23.  PAP 03/18/21 - negative with negative HPV.  Atrophy  changes.  Colonoscopy 05/2019 - diverticulosis and internal hemorrhoids. Mammogram 05/19/22 - Birads I.  Order placed for mammogram.  She will schedule.    Encounter for hepatitis C screening test for low risk patient -     Hepatitis C antibody  Screening for HIV without presence of risk factors -     HIV Antibody (routine testing w rflx)  Visit for screening mammogram -     3D Screening Mammogram, Left and Right; Future  Neck pain Assessment & Plan: Increased pain.  Persistent pain, but has worsened recently.  Increased pain with looking up.  Check c-spine xray.    Orders: -     DG Cervical Spine 2 or 3 views; Future  Inguinal hernia without obstruction or gangrene, recurrence not specified, unspecified laterality Assessment & Plan: Right indirect inguinal hernia repair with excision of lipoma 07/25/22 (Dr Lemar Livings).  Doing well.    Tremor Assessment & Plan: Mild head tremor noted on exam.  FTN intact.  Gait wnl.  No other neurological changes noted on exam. Discussed further evaluation.  She wants to monitor at this time.  Follow.     Hip pain, right Assessment & Plan: Occasional pain. Better when up and moving /exercising.  Wants to monitor.       Dale Cameron, MD

## 2023-03-28 NOTE — Assessment & Plan Note (Signed)
Increased pain.  Persistent pain, but has worsened recently.  Increased pain with looking up.  Check c-spine xray.

## 2023-03-28 NOTE — Assessment & Plan Note (Signed)
Blood pressure as outlined.  Doing well. On amlodipine 10mg  q day and losartan/hctz.  Follow pressures.  Follow metabolic panel

## 2023-03-28 NOTE — Assessment & Plan Note (Signed)
The 10-year ASCVD risk score (Arnett DK, et al., 2019) is: 5.5%   Values used to calculate the score:     Age: 67 years     Sex: Female     Is Non-Hispanic African American: No     Diabetic: No     Tobacco smoker: No     Systolic Blood Pressure: 120 mmHg     Is BP treated: Yes     HDL Cholesterol: 60.2 mg/dL     Total Cholesterol: 187 mg/dL  Low cholesterol diet and exercise.  Follow lipid panel.

## 2023-03-28 NOTE — Assessment & Plan Note (Addendum)
Physical today 03/28/23.  PAP 03/18/21 - negative with negative HPV.  Atrophy changes.  Colonoscopy 05/2019 - diverticulosis and internal hemorrhoids. Mammogram 05/19/22 - Birads I.  Order placed for mammogram.  She will schedule.

## 2023-03-29 LAB — HEPATITIS C ANTIBODY: Hepatitis C Ab: NONREACTIVE

## 2023-03-29 LAB — HIV ANTIBODY (ROUTINE TESTING W REFLEX): HIV 1&2 Ab, 4th Generation: NONREACTIVE

## 2023-04-05 ENCOUNTER — Other Ambulatory Visit: Payer: Self-pay | Admitting: Internal Medicine

## 2023-05-24 ENCOUNTER — Ambulatory Visit
Admission: RE | Admit: 2023-05-24 | Discharge: 2023-05-24 | Disposition: A | Discharge status: Home / Self Care | Payer: Medicare PPO | Source: Ambulatory Visit | Attending: Internal Medicine | Admitting: Internal Medicine

## 2023-05-24 DIAGNOSIS — Z1231 Encounter for screening mammogram for malignant neoplasm of breast: Secondary | ICD-10-CM | POA: Diagnosis present

## 2023-09-28 ENCOUNTER — Ambulatory Visit: Payer: Medicare PPO | Admitting: Internal Medicine

## 2023-09-28 VITALS — BP 122/74 | HR 68 | Temp 97.8°F | Resp 16 | Ht 62.0 in | Wt 136.0 lb

## 2023-09-28 DIAGNOSIS — M542 Cervicalgia: Secondary | ICD-10-CM | POA: Diagnosis not present

## 2023-09-28 DIAGNOSIS — I10 Essential (primary) hypertension: Secondary | ICD-10-CM

## 2023-09-28 DIAGNOSIS — E7800 Pure hypercholesterolemia, unspecified: Secondary | ICD-10-CM

## 2023-09-28 DIAGNOSIS — F439 Reaction to severe stress, unspecified: Secondary | ICD-10-CM

## 2023-09-28 DIAGNOSIS — H938X3 Other specified disorders of ear, bilateral: Secondary | ICD-10-CM

## 2023-09-28 LAB — CBC WITH DIFFERENTIAL/PLATELET
Basophils Absolute: 0 10*3/uL (ref 0.0–0.1)
Basophils Relative: 0.7 % (ref 0.0–3.0)
Eosinophils Absolute: 0.1 10*3/uL (ref 0.0–0.7)
Eosinophils Relative: 2.1 % (ref 0.0–5.0)
HCT: 45.1 % (ref 36.0–46.0)
Hemoglobin: 14.9 g/dL (ref 12.0–15.0)
Lymphocytes Relative: 37.7 % (ref 12.0–46.0)
Lymphs Abs: 1.7 10*3/uL (ref 0.7–4.0)
MCHC: 33.1 g/dL (ref 30.0–36.0)
MCV: 96.8 fL (ref 78.0–100.0)
Monocytes Absolute: 0.4 10*3/uL (ref 0.1–1.0)
Monocytes Relative: 9.3 % (ref 3.0–12.0)
Neutro Abs: 2.2 10*3/uL (ref 1.4–7.7)
Neutrophils Relative %: 50.2 % (ref 43.0–77.0)
Platelets: 232 10*3/uL (ref 150.0–400.0)
RBC: 4.66 Mil/uL (ref 3.87–5.11)
RDW: 14.3 % (ref 11.5–15.5)
WBC: 4.4 10*3/uL (ref 4.0–10.5)

## 2023-09-28 LAB — LIPID PANEL
Cholesterol: 210 mg/dL — ABNORMAL HIGH (ref 0–200)
HDL: 69.5 mg/dL (ref >39.00)
LDL Cholesterol: 114 mg/dL — ABNORMAL HIGH (ref 0–99)
NonHDL: 140.33
Total CHOL/HDL Ratio: 3
Triglycerides: 132 mg/dL (ref 0.0–149.0)
VLDL: 26.4 mg/dL (ref 0.0–40.0)

## 2023-09-28 LAB — HEPATIC FUNCTION PANEL
ALT: 18 U/L (ref 0–35)
AST: 20 U/L (ref 0–37)
Albumin: 4.7 g/dL (ref 3.5–5.2)
Alkaline Phosphatase: 82 U/L (ref 39–117)
Bilirubin, Direct: 0.2 mg/dL (ref 0.0–0.3)
Total Bilirubin: 1.4 mg/dL — ABNORMAL HIGH (ref 0.2–1.2)
Total Protein: 7.3 g/dL (ref 6.0–8.3)

## 2023-09-28 LAB — BASIC METABOLIC PANEL
BUN: 19 mg/dL (ref 6–23)
CO2: 29 meq/L (ref 19–32)
Calcium: 9.3 mg/dL (ref 8.4–10.5)
Chloride: 100 meq/L (ref 96–112)
Creatinine, Ser: 0.67 mg/dL (ref 0.40–1.20)
GFR: 91.72 mL/min (ref >60.00)
Glucose, Bld: 86 mg/dL (ref 70–99)
Potassium: 3.5 meq/L (ref 3.5–5.1)
Sodium: 137 meq/L (ref 135–145)

## 2023-09-28 MED ORDER — METOPROLOL SUCCINATE ER 25 MG PO TB24: ORAL_TABLET | ORAL | 3 refills | Status: DC

## 2023-09-28 MED ORDER — LOSARTAN POTASSIUM-HCTZ 100-25 MG PO TABS: 1.0000 | ORAL_TABLET | Freq: Every day | ORAL | 3 refills | Status: DC

## 2023-09-28 MED ORDER — DEBROX 6.5 % OT SOLN: OTIC | 0 refills | Status: AC

## 2023-09-28 MED ORDER — AMLODIPINE BESYLATE 10 MG PO TABS: 10.0000 mg | ORAL_TABLET | Freq: Every day | ORAL | 3 refills | Status: DC

## 2023-09-28 NOTE — Progress Notes (Signed)
 Subjective:    Patient ID: Melinda Barber, female    DOB: 09/06/1956, 68 y.o.   MRN: [id number]  Patient here for  Chief Complaint  Patient presents with   Medical Management of Chronic Issues    HPI Here for a scheduled follow up - follow up regarding hypercholesterolemia and hypertension. States she is doing relatively well.  Some increased family stress. Overall she feels she is handling things relatively well. No chest pain or sob reported.  GI issues - doing well. Persistent neck pain.  Discussed xray - degenerative changes. Discussed PT. She also reports some head fullness and ear fullness. Ringing in her ears = left > right.  Some nasal stuffiness. No sore throat. No fever. No chest congestion or cough.    Past Medical History:  Diagnosis Date   Carotid bruit    Complication of anesthesia    with 1st colonoscopy in [place] was given too much anesthesia and was loopy and had stomach issues for days-2nd colonoscopy no problems   Elevated hemoglobin (HCC)    GERD (gastroesophageal reflux disease)    occ no meds   Hyperbilirubinemia    Hypercholesterolemia    Hypertension    Inguinal hernia    Palpitations    Tremor    Past Surgical History:  Procedure Laterality Date   COLONOSCOPY     INGUINAL HERNIA REPAIR Right 07/25/2022   Procedure: HERNIA REPAIR INGUINAL ADULT;  Surgeon: Dessa Reyes ORN, MD;  Location: [facility] ORS;  Service: General;  Laterality: Right;   INSERTION OF MESH Right 07/25/2022   Procedure: INSERTION OF MESH;  Surgeon: Dessa Reyes ORN, MD;  Location: [facility] ORS;  Service: General;  Laterality: Right;   Family History  Problem Relation Age of Onset   Breast cancer Other 76   Hypertension Mother    Hypercholesterolemia Mother    Diabetes Mother    Transient ischemic attack Mother    Aneurysm Father    Hypertension Brother    Aneurysm Brother        heart   Colon cancer Neg Hx    Social History   Socioeconomic History   Marital status:  Married    Spouse name: Not on file   Number of children: 3   Years of education: Not on file   Highest education level: Not on file  Occupational History   Not on file  Tobacco Use   Smoking status: Former    Current packs/day: 0.00    Average packs/day: 0.5 packs/day for 12.0 years (6.0 ttl pk-yrs)    Types: Cigarettes    Start date: 09/20/1979    Quit date: 09/20/1991    Years since quitting: 32.0   Smokeless tobacco: Never  Vaping Use   Vaping status: Never Used  Substance and Sexual Activity   Alcohol use: Yes    Comment: occ wine   Drug use: No   Sexual activity: Not on file  Other Topics Concern   Not on file  Social History Narrative   Not on file   Social Drivers of Health   Financial Resource Strain: Not on file  Food Insecurity: Not on file  Transportation Needs: Not on file  Physical Activity: Not on file  Stress: Not on file  Social Connections: Not on file     Review of Systems  Constitutional:  Negative for appetite change, fever and unexpected weight change.  HENT:  Positive for congestion. Negative for sinus pressure.  Ear fullness as outlined.   Respiratory:  Negative for cough, chest tightness and shortness of breath.   Cardiovascular:  Negative for chest pain, palpitations and leg swelling.  Gastrointestinal:  Negative for abdominal pain, diarrhea, nausea and vomiting.  Genitourinary:  Negative for difficulty urinating and dysuria.  Musculoskeletal:  Positive for neck pain. Negative for joint swelling and myalgias.  Skin:  Negative for color change and rash.  Neurological:  Negative for dizziness and headaches.  Psychiatric/Behavioral:  Negative for agitation and dysphoric mood.        Objective:     BP 122/74   Pulse 68   Temp 97.8 F (36.6 C)   Resp 16   Ht 5' 2 (1.575 m)   Wt 136 lb (61.7 kg)   LMP 06/28/2006   SpO2 98%   BMI 24.87 kg/m  Wt Readings from Last 3 Encounters:  09/28/23 136 lb (61.7 kg)  03/28/23 138 lb (62.6  kg)  08/03/22 138 lb (62.6 kg)    Physical Exam Vitals reviewed.  Constitutional:      General: She is not in acute distress.    Appearance: Normal appearance.  HENT:     Head: Normocephalic and atraumatic.     Right Ear: External ear normal.     Left Ear: External ear normal.     Ears:     Comments: Bilateral cerumen impaction.     Mouth/Throat:     Pharynx: No oropharyngeal exudate or posterior oropharyngeal erythema.  Eyes:     General: No scleral icterus.       Right eye: No discharge.        Left eye: No discharge.     Conjunctiva/sclera: Conjunctivae normal.  Neck:     Thyroid : No thyromegaly.  Cardiovascular:     Rate and Rhythm: Normal rate and regular rhythm.  Pulmonary:     Effort: No respiratory distress.     Breath sounds: Normal breath sounds. No wheezing.  Abdominal:     General: Bowel sounds are normal.     Palpations: Abdomen is soft.     Tenderness: There is no abdominal tenderness.  Musculoskeletal:        General: No swelling or tenderness.     Cervical back: Neck supple. No tenderness.  Lymphadenopathy:     Cervical: No cervical adenopathy.  Skin:    Findings: No erythema or rash.  Neurological:     Mental Status: She is alert.  Psychiatric:        Mood and Affect: Mood normal.        Behavior: Behavior normal.      Outpatient Encounter Medications as of 09/28/2023  Medication Sig   calcium carbonate (OSCAL) 1500 (600 Ca) MG TABS tablet Take 1,500 mg by mouth 2 (two) times daily with a meal.   carbamide peroxide (DEBROX) 6.5 % OTIC solution 3-4 drops in each ear daily.  Massage for approximately 5 minutes.   Multiple Vitamin (MULTIVITAMIN) capsule Take 1 capsule by mouth daily.   [DISCONTINUED] amLODipine  (NORVASC ) 10 MG tablet TAKE 1 TABLET BY MOUTH EVERY DAY   [DISCONTINUED] losartan -hydrochlorothiazide (HYZAAR) 100-25 MG tablet TAKE 1 TABLET BY MOUTH EVERY DAY   [DISCONTINUED] metoprolol  succinate (TOPROL -XL) 25 MG 24 hr tablet TAKE 1  TABLET BY MOUTH EVERY DAY   amLODipine  (NORVASC ) 10 MG tablet Take 1 tablet (10 mg total) by mouth daily.   losartan -hydrochlorothiazide (HYZAAR) 100-25 MG tablet Take 1 tablet by mouth daily.   metoprolol  succinate (TOPROL -XL) 25 MG  24 hr tablet TAKE 1 TABLET BY MOUTH EVERY DAY   No facility-administered encounter medications on file as of 09/28/2023.     Lab Results  Component Value Date   WBC 4.4 09/28/2023   HGB 14.9 09/28/2023   HCT 45.1 09/28/2023   PLT 232.0 09/28/2023   GLUCOSE 86 09/28/2023   CHOL 210 (H) 09/28/2023   TRIG 132.0 09/28/2023   HDL 69.50 09/28/2023   LDLDIRECT 112.0 06/14/2018   LDLCALC 114 (H) 09/28/2023   ALT 18 09/28/2023   AST 20 09/28/2023   NA 137 09/28/2023   K 3.5 09/28/2023   CL 100 09/28/2023   CREATININE 0.67 09/28/2023   BUN 19 09/28/2023   CO2 29 09/28/2023   TSH 1.19 03/28/2023   HGBA1C 5.3 03/27/2019    MM 3D SCREENING MAMMOGRAM BILATERAL BREAST Result Date: 05/25/2023 CLINICAL DATA:  Screening. EXAM: DIGITAL SCREENING BILATERAL MAMMOGRAM WITH TOMOSYNTHESIS AND CAD TECHNIQUE: Bilateral screening digital craniocaudal and mediolateral oblique mammograms were obtained. Bilateral screening digital breast tomosynthesis was performed. The images were evaluated with computer-aided detection. COMPARISON:  Previous exam(s). ACR Breast Density Category b: There are scattered areas of fibroglandular density. FINDINGS: There are no findings suspicious for malignancy. IMPRESSION: No mammographic evidence of malignancy. A result letter of this screening mammogram will be mailed directly to the patient. RECOMMENDATION: Screening mammogram in one year. (Code:[id number]-01Y) BI-RADS CATEGORY  1: Negative. Electronically Signed   By: Delon Music M.D.   On: 05/25/2023 07:23       Assessment & Plan:  Hypercholesterolemia Assessment & Plan: The 10-year ASCVD risk score (Arnett DK, et al., 2019) is: 6.3%   Values used to calculate the score:     Age: 69 years      Sex: Female     Is Non-Hispanic African American: No     Diabetic: No     Tobacco smoker: No     Systolic Blood Pressure: 122 mmHg     Is BP treated: Yes     HDL Cholesterol: 69.5 mg/dL     Total Cholesterol: 210 mg/dL  Low cholesterol diet and exercise.  Follow lipid panel.   Orders: -     CBC with Differential/Platelet -     Lipid panel -     Basic metabolic panel -     Hepatic function panel  Neck pain Assessment & Plan: Persistent neck pain.  Discussed xray - degenerative changes. Discussed PT. Agreeable. Refer to PT for further evaluation and treatment.   Orders: -     Ambulatory referral to Physical Therapy  Stress Assessment & Plan: Overall feels she is handling things relatively well.  Follow.    Primary hypertension Assessment & Plan: Blood pressure as outlined.  Doing well. On amlodipine  10mg  q day and losartan /hctz.  Follow pressures.  Follow metabolic panel    Sensation of fullness in both ears Assessment & Plan: Cerumen impaction. Debrox. Return for bilateral ear irrigation. Saline nasal spray or steroid nasal spray    Other orders -     amLODIPine  Besylate; Take 1 tablet (10 mg total) by mouth daily.  Dispense: 90 tablet; Refill: 3 -     Losartan  Potassium-HCTZ; Take 1 tablet by mouth daily.  Dispense: 90 tablet; Refill: 3 -     Metoprolol  Succinate ER; TAKE 1 TABLET BY MOUTH EVERY DAY  Dispense: 90 tablet; Refill: 3 -     Debrox; 3-4 drops in each ear daily.  Massage for approximately 5 minutes.  Dispense: 15 mL; Refill:  0     Allena Hamilton, MD

## 2023-09-28 NOTE — Patient Instructions (Signed)
 Saline nasal spray - flush nose daily  Nasacort nasal spray - 2 sprays each nostril one time per day.  Do this in the evening.

## 2023-10-01 ENCOUNTER — Encounter: Payer: Self-pay | Admitting: Internal Medicine

## 2023-10-01 DIAGNOSIS — H938X9 Other specified disorders of ear, unspecified ear: Secondary | ICD-10-CM | POA: Insufficient documentation

## 2023-10-01 NOTE — Assessment & Plan Note (Signed)
 Cerumen impaction. Debrox. Return for bilateral ear irrigation. Saline nasal spray or steroid nasal spray

## 2023-10-01 NOTE — Assessment & Plan Note (Signed)
 The 10-year ASCVD risk score (Arnett DK, et al., 2019) is: 6.3%   Values used to calculate the score:     Age: 68 years     Sex: Female     Is Non-Hispanic African American: No     Diabetic: No     Tobacco smoker: No     Systolic Blood Pressure: 122 mmHg     Is BP treated: Yes     HDL Cholesterol: 69.5 mg/dL     Total Cholesterol: 210 mg/dL  Low cholesterol diet and exercise.  Follow lipid panel.

## 2023-10-01 NOTE — Assessment & Plan Note (Signed)
Overall feels she is handling things relatively well.  Follow.   

## 2023-10-01 NOTE — Assessment & Plan Note (Signed)
 Persistent neck pain.  Discussed xray - degenerative changes. Discussed PT. Agreeable. Refer to PT for further evaluation and treatment.

## 2023-10-01 NOTE — Assessment & Plan Note (Signed)
Blood pressure as outlined.  Doing well. On amlodipine 10mg  q day and losartan/hctz.  Follow pressures.  Follow metabolic panel

## 2023-10-02 ENCOUNTER — Other Ambulatory Visit: Payer: Self-pay

## 2023-10-02 DIAGNOSIS — R17 Unspecified jaundice: Secondary | ICD-10-CM

## 2023-10-03 ENCOUNTER — Ambulatory Visit (INDEPENDENT_AMBULATORY_CARE_PROVIDER_SITE_OTHER): Payer: Medicare PPO

## 2023-10-03 ENCOUNTER — Telehealth: Payer: Self-pay

## 2023-10-03 DIAGNOSIS — H6123 Impacted cerumen, bilateral: Secondary | ICD-10-CM

## 2023-10-03 DIAGNOSIS — H6122 Impacted cerumen, left ear: Secondary | ICD-10-CM

## 2023-10-03 NOTE — Telephone Encounter (Signed)
 ENT referral pended for you per our discussion

## 2023-10-03 NOTE — Progress Notes (Signed)
 Cerumen Impaction Patient presents with Wax Impaction in both ears for the past 10 days. There is not a prior history of cerumen impaction. The patient has been using ear drops to loosen wax immediately prior to this visit. The patient denies ear pain.  .    Before I began flushing I checked in each ear so that I can track my progress. I had pt feel the water to see if it was too hot or too cold. Pt verbalized it was okay so I proceeded; periodically asking if patient was okay.    Process was completed & pt denied any pain or dizziness.  Ear were visualized one last time using otoscope prior to provider taking over.

## 2023-10-04 NOTE — Telephone Encounter (Signed)
Order placed for ENT referral.   

## 2023-10-24 ENCOUNTER — Ambulatory Visit: Payer: Medicare PPO | Admitting: Physical Therapy

## 2023-10-26 ENCOUNTER — Encounter: Payer: Medicare PPO | Admitting: Physical Therapy

## 2023-10-31 ENCOUNTER — Other Ambulatory Visit: Payer: Medicare PPO

## 2023-10-31 ENCOUNTER — Encounter: Payer: Medicare PPO | Admitting: Physical Therapy

## 2023-11-01 NOTE — Therapy (Signed)
OUTPATIENT PHYSICAL THERAPY CERVICAL EVALUATION   Patient Name: Melinda Barber MRN: [id number] DOB:01/16/1956, 68 y.o., female Today's Date: 11/02/2023  END OF SESSION:  PT End of Session - 11/02/23 0805     Visit Number 1    Number of Visits 17    Date for PT Re-Evaluation 12/28/23    PT Start Time 0813    PT Stop Time 0859    PT Time Calculation (min) 46 min    Activity Tolerance Patient tolerated treatment well    Behavior During Therapy [facility] for tasks assessed/performed             Past Medical History:  Diagnosis Date   Carotid bruit    Complication of anesthesia    with 1st colonoscopy in [place] was "given too much anesthesia" and was loopy and had stomach issues for days-2nd colonoscopy no problems   Elevated hemoglobin (HCC)    GERD (gastroesophageal reflux disease)    occ no meds   Hyperbilirubinemia    Hypercholesterolemia    Hypertension    Inguinal hernia    Palpitations    Tremor    Past Surgical History:  Procedure Laterality Date   COLONOSCOPY     INGUINAL HERNIA REPAIR Right 07/25/2022   Procedure: HERNIA REPAIR INGUINAL ADULT;  Surgeon: Earline Mayotte, MD;  Location: [facility] ORS;  Service: General;  Laterality: Right;   INSERTION OF MESH Right 07/25/2022   Procedure: INSERTION OF MESH;  Surgeon: Earline Mayotte, MD;  Location: [facility] ORS;  Service: General;  Laterality: Right;   Patient Active Problem List   Diagnosis Date Noted   Ear fullness 10/01/2023   Neck pain 03/28/2023   Hip pain, right 03/28/2023   Breast cancer screening 05/15/2022   Carotid bruit 03/22/2022   Headache 03/22/2022   Abdominal pain 03/22/2022   Tremor 03/23/2020   Inguinal hernia 03/23/2020   Palpitations 03/17/2018   Stress 03/09/2015   Health care maintenance 11/10/2014   Hyperbilirubinemia 09/09/2012   Menopausal syndrome 09/09/2012   Hypertension 09/06/2012   Hypercholesterolemia 09/06/2012    PCP: Dale [place], MD  REFERRING PROVIDER: Dale Kechi, MD  REFERRING DIAG: M54.2 (ICD-10-CM) - Neck pain  THERAPY DIAG:  Muscle weakness (generalized)  Muscle tightness  Neck pain  Rationale for Evaluation and Treatment: Rehabilitation  ONSET DATE: couple of years ago   SUBJECTIVE:  SUBJECTIVE STATEMENT: Patient reports 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 Pain is usually in the posterior neck but at times can extend to mid back. Does not limit her from daily activities. Patient bought a neck pillow to see if it will help but it did not. Patient reports she has headaches somewhat frequently but unsure if related to the neck or her new dogs' fur in the home.  Hand dominance: Right  PERTINENT HISTORY:  Persistent neck pain for months and is aggravated with looking up.  PMH: head tremor (family hx of tremors), HTN, inguinal hernia (s/p repair 07/25/22)  PAIN:  Are you having pain? Yes: NPRS scale: 1-2/10 at the moment, 6/10 at worst  Pain location: posterior neck  Pain description: dull, achy, constant  Aggravating factors: not specific aggravating factors  Relieving factors: heat  PRECAUTIONS: None  RED FLAGS: Cervical red flags: None       WEIGHT BEARING RESTRICTIONS: No  FALLS:  Has patient fallen in last 6 months? No  LIVING ENVIRONMENT: Lives with: lives with their spouse  OCCUPATION: retired Runner, broadcasting/film/video   PLOF: Independent  PATIENT GOALS: to take the pain away   NEXT MD VISIT: 03/2024  OBJECTIVE:  Note: Objective measures were completed at Evaluation unless otherwise noted.  DIAGNOSTIC FINDINGS:  EXAM (03/28/23):  CERVICAL SPINE - 3 VIEW IMPRESSION: Degenerative changes. No acute osseous abnormalities.  PATIENT SURVEYS:  NDI 5/50 = 10%  COGNITION: Overall cognitive status: Within  functional limits for tasks assessed  SENSATION: WFL  POSTURE: rounded shoulders and forward head  PALPATION: TTP to suboccipitals, B UT (R>L), B levator scapulae Muscle tightness notable to R>L UT and B levator scapulae.    CERVICAL ROM:   Active ROM A/PROM (deg) eval  Flexion WFL  Extension WFL  Right lateral flexion WFL  Left lateral flexion WFL  Right rotation WFL  Left rotation WFL   (Blank rows = not tested)  UPPER EXTREMITY MMT:  MMT Right eval Left eval  Shoulder flexion 4 4  Shoulder extension    Shoulder abduction 4+ 4+  Shoulder adduction    Shoulder internal rotation 4 4  Shoulder external rotation 4 4  Middle trapezius    Lower trapezius    Elbow flexion 4+ 4+  Elbow extension 4+ 4+   (Blank rows = not tested)  CERVICAL SPECIAL  TESTS:  Neck flexor muscle endurance test: 18.11 seconds, Spurling's test: Negative, and Distraction test: Negative  FUNCTIONAL TESTS:  Neck flexor muscle endurance test: 18.11 seconds   TREATMENT DATE: 11/02/23                                                                                                                               PATIENT EDUCATION:  Education details: HEP, POC, goals  Person educated: Patient Education method: Explanation, Demonstration, and Handouts Education comprehension: verbalized understanding and returned demonstration  HOME EXERCISE PROGRAM: Access Code: [email or web address] URL: [email or web address] Date: 11/02/2023  Prepared by: Maylon Peppers  Exercises - Seated Upper Trapezius Stretch  - 2-3 x daily - 5-7 x weekly - 3-5 reps - 30-60 seconds  hold - Seated Levator Scapulae Stretch  - 2-3 x daily - 5-7 x weekly - 3-5 reps - 30-60 seconds hold - Seated Shoulder Rolls  - 2-3 x daily - 5-7 x weekly - 10 reps  ASSESSMENT:  CLINICAL IMPRESSION: Patient is a 68 y.o. female who was seen today for physical therapy evaluation and treatment for neck pain. Patient demonstrates weakness,  neck musculature tightness, decreased activity tolerance, and pain. B UT (R>L) and B levator scapulae are significantly tight. HEP provided this date with UT and levator stretch as well as posterior shoulder rolls for postural correction. Patient complaining of constant, dull, and achy pain in neck which limit her ability to read as her hobby. Patient will benefit from skilled PT interventions to address listed impairments to reduce pain and improve quality of life.   OBJECTIVE IMPAIRMENTS: decreased activity tolerance, decreased endurance, decreased strength, hypomobility, impaired flexibility, postural dysfunction, and pain.   ACTIVITY LIMITATIONS: sleeping and reach over head  PARTICIPATION LIMITATIONS: driving, shopping, and community activity  PERSONAL FACTORS: Age, Behavior pattern, and Time since onset of injury/illness/exacerbation are also affecting patient's functional outcome.   REHAB POTENTIAL: Good  CLINICAL DECISION MAKING: Stable/uncomplicated  EVALUATION COMPLEXITY: Moderate   GOALS: Goals reviewed with patient? Yes  SHORT TERM GOALS: Target date: 11/29/2023   Patient will be independent in HEP to improve strength/mobility for better functional independence with ADLs. Baseline: 2/13: HEP initiated  Goal status: INITIAL   LONG TERM GOALS: Target date: 12/27/2023  Patient will reduce Neck Disability Index score to <6% to demonstrate minimal disability with ADL's including improved sleeping tolerance, sitting tolerance, etc for better mobility at home and work. Baseline: 2/13: 10%  Goal status: INITIAL  2.  Patient will report <3/10 pain at worst in neck on NRPS to demonstrate improved tolerance and ability to perform iADLs and other hobbies (I.e reading, carrying for grandchildren).   Baseline: 2/13: 6/10 Goal status: INITIAL  3.  Patient will improve B UE strength by 0.5 MMT to demonstrate improved tolerance to iADLs and improve overall functional strength for  postural correction.  Baseline: 2/13: see above  Goal status: INITIAL  4. Patient will be able to read >45 minutes with improved mechanics and <2/10 pain in neck on NRPS to improve tolerance to participate in favorite hobby.  Baseline:  Goal status: INITIAL   PLAN:  PT FREQUENCY: 1-2x/week  PT DURATION: 8 weeks  PLANNED INTERVENTIONS: 97164- PT Re-evaluation, 97110-Therapeutic exercises, 97530- Therapeutic activity, 97112- Neuromuscular re-education, 97535- Self Care, 16109- Manual therapy, 97014- Electrical stimulation (unattended), [phone]- Electrical stimulation (manual), [id number]- Traction (mechanical), Patient/Family education, Taping, Dry Needling, Joint mobilization, Joint manipulation, Spinal manipulation, Spinal mobilization, Cryotherapy, and Moist heat  PLAN FOR NEXT SESSION: HEP review, chin tucks, manual stretching/STM, updated HEP with strengthening since she is only coming 1x/week due to her schedule for watching her grandchild    Maylon Peppers, PT, DPT Physical Therapist - [facility]  [facility]  11/02/2023, 9:23 AM

## 2023-11-02 ENCOUNTER — Ambulatory Visit: Payer: Medicare PPO | Attending: Internal Medicine

## 2023-11-02 ENCOUNTER — Encounter: Payer: Self-pay | Admitting: Physical Therapy

## 2023-11-02 DIAGNOSIS — M6289 Other specified disorders of muscle: Secondary | ICD-10-CM | POA: Diagnosis present

## 2023-11-02 DIAGNOSIS — M542 Cervicalgia: Secondary | ICD-10-CM | POA: Diagnosis present

## 2023-11-02 DIAGNOSIS — M6281 Muscle weakness (generalized): Secondary | ICD-10-CM | POA: Diagnosis present

## 2023-11-06 NOTE — Therapy (Incomplete)
OUTPATIENT PHYSICAL THERAPY CERVICAL EVALUATION   Patient Name: Melinda Barber MRN: [id number] DOB:Feb 05, 1956, 68 y.o., female Today's Date: 11/06/2023  END OF SESSION:    Past Medical History:  Diagnosis Date   Carotid bruit    Complication of anesthesia    with 1st colonoscopy in [place] was "given too much anesthesia" and was loopy and had stomach issues for days-2nd colonoscopy no problems   Elevated hemoglobin (HCC)    GERD (gastroesophageal reflux disease)    occ no meds   Hyperbilirubinemia    Hypercholesterolemia    Hypertension    Inguinal hernia    Palpitations    Tremor    Past Surgical History:  Procedure Laterality Date   COLONOSCOPY     INGUINAL HERNIA REPAIR Right 07/25/2022   Procedure: HERNIA REPAIR INGUINAL ADULT;  Surgeon: Earline Mayotte, MD;  Location: [facility];  Service: General;  Laterality: Right;   INSERTION OF MESH Right 07/25/2022   Procedure: INSERTION OF MESH;  Surgeon: Earline Mayotte, MD;  Location: [facility];  Service: General;  Laterality: Right;   Patient Active Problem List   Diagnosis Date Noted   Ear fullness 10/01/2023   Neck pain 03/28/2023   Hip pain, right 03/28/2023   Breast cancer screening 05/15/2022   Carotid bruit 03/22/2022   Headache 03/22/2022   Abdominal pain 03/22/2022   Tremor 03/23/2020   Inguinal hernia 03/23/2020   Palpitations 03/17/2018   Stress 03/09/2015   Health care maintenance 11/10/2014   Hyperbilirubinemia 09/09/2012   Menopausal syndrome 09/09/2012   Hypertension 09/06/2012   Hypercholesterolemia 09/06/2012    PCP: Dale [place], MD  REFERRING PROVIDER: Dale Theodosia, MD  REFERRING DIAG: M54.2 (ICD-10-CM) - Neck pain  THERAPY DIAG:  Muscle weakness (generalized)  Muscle tightness  Neck pain  Rationale for Evaluation and Treatment: Rehabilitation  ONSET DATE: couple of years ago   SUBJECTIVE:                                                                                                                                                                                                          SUBJECTIVE STATEMENT: Patient reports 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 Pain is usually in the posterior neck but at times can extend to mid back. Does not limit her from daily activities. Patient bought a neck pillow to see if it will help but it did not. Patient reports she has headaches somewhat frequently but unsure if related to the neck or her new dogs' fur in the home.  Hand dominance:  Right  PERTINENT HISTORY:  Persistent neck pain for months and is aggravated with looking up.  PMH: head tremor (family hx of tremors), HTN, inguinal hernia (s/p repair 07/25/22)  PAIN:  Are you having pain? Yes: NPRS scale: 1-2/10 at the moment, 6/10 at worst  Pain location: posterior neck  Pain description: dull, achy, constant  Aggravating factors: not specific aggravating factors  Relieving factors: heat  PRECAUTIONS: None  RED FLAGS: Cervical red flags: None       WEIGHT BEARING RESTRICTIONS: No  FALLS:  Has patient fallen in last 6 months? No  LIVING ENVIRONMENT: Lives with: lives with their spouse  OCCUPATION: retired Runner, broadcasting/film/video   PLOF: Independent  PATIENT GOALS: to take the pain away   NEXT MD VISIT: 03/2024  OBJECTIVE:  Note: Objective measures were completed at Evaluation unless otherwise noted.  DIAGNOSTIC FINDINGS:  EXAM (03/28/23):  CERVICAL SPINE - 3 VIEW IMPRESSION: Degenerative changes. No acute osseous abnormalities.  PATIENT SURVEYS:  NDI 5/50 = 10%  COGNITION: Overall cognitive status: Within functional limits for tasks assessed  SENSATION: WFL  POSTURE: rounded shoulders and forward head  PALPATION: TTP to suboccipitals, B UT (R>L), B levator scapulae Muscle tightness notable to R>L UT and B levator scapulae.    CERVICAL ROM:   Active ROM A/PROM (deg) eval  Flexion WFL  Extension WFL  Right lateral flexion  WFL  Left lateral flexion WFL  Right rotation WFL  Left rotation WFL   (Blank rows = not tested)  UPPER EXTREMITY MMT:  MMT Right eval Left eval  Shoulder flexion 4 4  Shoulder extension    Shoulder abduction 4+ 4+  Shoulder adduction    Shoulder internal rotation 4 4  Shoulder external rotation 4 4  Middle trapezius    Lower trapezius    Elbow flexion 4+ 4+  Elbow extension 4+ 4+   (Blank rows = not tested)  CERVICAL SPECIAL  TESTS:  Neck flexor muscle endurance test: 18.11 seconds, Spurling's test: Negative, and Distraction test: Negative  FUNCTIONAL TESTS:  Neck flexor muscle endurance test: 18.11 seconds   TREATMENT DATE: 11/06/23                                                                                                                              *** Subjective:    PATIENT EDUCATION:  Education details: HEP, POC, goals  Person educated: Patient Education method: Explanation, Demonstration, and Handouts Education comprehension: verbalized understanding and returned demonstration  HOME EXERCISE PROGRAM: Access Code: [email or web address] URL: [email or web address] Date: 11/02/2023 Prepared by: Maylon Peppers  Exercises - Seated Upper Trapezius Stretch  - 2-3 x daily - 5-7 x weekly - 3-5 reps - 30-60 seconds  hold - Seated Levator Scapulae Stretch  - 2-3 x daily - 5-7 x weekly - 3-5 reps - 30-60 seconds hold - Seated Shoulder Rolls  - 2-3 x daily - 5-7 x weekly - 10 reps  ASSESSMENT:  CLINICAL IMPRESSION: ***  Patient is a 68 y.o. female who was seen today for physical therapy evaluation and treatment for neck pain. Patient demonstrates weakness, neck musculature tightness, decreased activity tolerance, and pain. B UT (R>L) and B levator scapulae are significantly tight. HEP provided this date with UT and levator stretch as well as posterior shoulder rolls for postural correction. Patient complaining of constant, dull, and achy pain in neck which limit  her ability to read as her hobby. Patient will benefit from skilled PT interventions to address listed impairments to reduce pain and improve quality of life.   OBJECTIVE IMPAIRMENTS: decreased activity tolerance, decreased endurance, decreased strength, hypomobility, impaired flexibility, postural dysfunction, and pain.   ACTIVITY LIMITATIONS: sleeping and reach over head  PARTICIPATION LIMITATIONS: driving, shopping, and community activity  PERSONAL FACTORS: Age, Behavior pattern, and Time since onset of injury/illness/exacerbation are also affecting patient's functional outcome.   REHAB POTENTIAL: Good  CLINICAL DECISION MAKING: Stable/uncomplicated  EVALUATION COMPLEXITY: Moderate   GOALS: Goals reviewed with patient? Yes  SHORT TERM GOALS: Target date: 11/29/2023   Patient will be independent in HEP to improve strength/mobility for better functional independence with ADLs. Baseline: 2/13: HEP initiated  Goal status: INITIAL   LONG TERM GOALS: Target date: 12/27/2023  Patient will reduce Neck Disability Index score to <6% to demonstrate minimal disability with ADL's including improved sleeping tolerance, sitting tolerance, etc for better mobility at home and work. Baseline: 2/13: 10%  Goal status: INITIAL  2.  Patient will report <3/10 pain at worst in neck on NRPS to demonstrate improved tolerance and ability to perform iADLs and other hobbies (I.e reading, carrying for grandchildren).   Baseline: 2/13: 6/10 Goal status: INITIAL  3.  Patient will improve B UE strength by 0.5 MMT to demonstrate improved tolerance to iADLs and improve overall functional strength for postural correction.  Baseline: 2/13: see above  Goal status: INITIAL  4. Patient will be able to read >45 minutes with improved mechanics and <2/10 pain in neck on NRPS to improve tolerance to participate in favorite hobby.  Baseline:  Goal status: INITIAL   PLAN:  PT FREQUENCY: 1-2x/week  PT DURATION:  8 weeks  PLANNED INTERVENTIONS: 97164- PT Re-evaluation, 97110-Therapeutic exercises, 97530- Therapeutic activity, 97112- Neuromuscular re-education, 97535- Self Care, [id number]- Manual therapy, 97014- Electrical stimulation (unattended), [phone]- Electrical stimulation (manual), [id number]- Traction (mechanical), Patient/Family education, Taping, Dry Needling, Joint mobilization, Joint manipulation, Spinal manipulation, Spinal mobilization, Cryotherapy, and Moist heat  PLAN FOR NEXT SESSION: HEP review, chin tucks, manual stretching/STM, updated HEP with strengthening since she is only coming 1x/week due to her schedule for watching her grandchild    Maylon Peppers, PT, DPT Physical Therapist - [facility]  11/06/2023, 2:03 PM

## 2023-11-07 ENCOUNTER — Ambulatory Visit: Payer: Medicare PPO

## 2023-11-07 DIAGNOSIS — M6289 Other specified disorders of muscle: Secondary | ICD-10-CM

## 2023-11-07 DIAGNOSIS — M542 Cervicalgia: Secondary | ICD-10-CM

## 2023-11-07 DIAGNOSIS — M6281 Muscle weakness (generalized): Secondary | ICD-10-CM | POA: Diagnosis not present

## 2023-11-07 NOTE — Therapy (Signed)
OUTPATIENT PHYSICAL THERAPY TREATMENT   Patient Name: Melinda Barber MRN: [id number] DOB:1956-03-15, 68 y.o., female Today's Date: 11/07/2023  END OF SESSION:  PT End of Session - 11/07/23 1032     Visit Number 2    Number of Visits 17    Date for PT Re-Evaluation 12/28/23    Authorization Type [employer]    Progress Note Due on Visit 10    PT Start Time 1030    PT Stop Time 1110    PT Time Calculation (min) 40 min    Activity Tolerance Patient tolerated treatment well;No increased pain    Behavior During Therapy WFL for tasks assessed/performed             Past Medical History:  Diagnosis Date   Carotid bruit    Complication of anesthesia    with 1st colonoscopy in [place] was "given too much anesthesia" and was loopy and had stomach issues for days-2nd colonoscopy no problems   Elevated hemoglobin (HCC)    GERD (gastroesophageal reflux disease)    occ no meds   Hyperbilirubinemia    Hypercholesterolemia    Hypertension    Inguinal hernia    Palpitations    Tremor    Past Surgical History:  Procedure Laterality Date   COLONOSCOPY     INGUINAL HERNIA REPAIR Right 07/25/2022   Procedure: HERNIA REPAIR INGUINAL ADULT;  Surgeon: Earline Mayotte, MD;  Location: [facility];  Service: General;  Laterality: Right;   INSERTION OF MESH Right 07/25/2022   Procedure: INSERTION OF MESH;  Surgeon: Earline Mayotte, MD;  Location: [facility];  Service: General;  Laterality: Right;   Patient Active Problem List   Diagnosis Date Noted   Ear fullness 10/01/2023   Neck pain 03/28/2023   Hip pain, right 03/28/2023   Breast cancer screening 05/15/2022   Carotid bruit 03/22/2022   Headache 03/22/2022   Abdominal pain 03/22/2022   Tremor 03/23/2020   Inguinal hernia 03/23/2020   Palpitations 03/17/2018   Stress 03/09/2015   Health care maintenance 11/10/2014   Hyperbilirubinemia 09/09/2012   Menopausal syndrome 09/09/2012   Hypertension 09/06/2012    Hypercholesterolemia 09/06/2012    PCP: Dale [place], MD  REFERRING PROVIDER: Dale [place], MD  REFERRING DIAG: M54.2 (ICD-10-CM) - Neck pain  THERAPY DIAG:  Muscle weakness (generalized)  Muscle tightness  Neck pain  Rationale for Evaluation and Treatment: Rehabilitation  ONSET DATE: couple of years ago   SUBJECTIVE:  SUBJECTIVE STATEMENT:  PERTINENT HISTORY:  Patient reports 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 Pain is usually in the posterior neck but at times can extend to mid back. Does not limit her from daily activities. Patient bought a neck pillow to see if it will help but it did not. Patient reports she has headaches somewhat frequently but unsure if related to the neck or her new dogs' fur in the home.   Hand dominance: Right  Persistent neck pain for months and is aggravated with looking up.  PMH: head tremor (family hx of tremors), HTN, inguinal hernia (s/p repair 07/25/22)  PAIN: Yes 2/10, constant low level ache    PRECAUTIONS: None  RED FLAGS: Cervical red flags: None       WEIGHT BEARING RESTRICTIONS: No  FALLS:  Has patient fallen in last 6 months? No  LIVING ENVIRONMENT: Lives with: lives with their spouse  OCCUPATION: retired Runner, broadcasting/film/video   PLOF: Independent  PATIENT GOALS: to take the pain away   NEXT MD VISIT: 03/2024  OBJECTIVE:  Note: Objective measures were completed at Evaluation unless otherwise noted.  DIAGNOSTIC FINDINGS:  EXAM (03/28/23):  CERVICAL SPINE - 3 VIEW IMPRESSION: Degenerative changes. No acute osseous abnormalities.  PATIENT SURVEYS:  NDI 5/50 = 10%  COGNITION: Overall cognitive status: Within functional limits for tasks assessed  SENSATION: WFL  POSTURE: rounded shoulders and forward  head  PALPATION: TTP to suboccipitals, B UT (R>L), B levator scapulae Muscle tightness notable to R>L UT and B levator scapulae.    CERVICAL ROM:   Active ROM A/PROM (deg) eval  Flexion WFL  Extension WFL  Right lateral flexion WFL  Left lateral flexion WFL  Right rotation WFL  Left rotation WFL   (Blank rows = not tested)  UPPER EXTREMITY MMT:  MMT Right eval Left eval  Shoulder flexion 4 4  Shoulder extension    Shoulder abduction 4+ 4+  Shoulder adduction    Shoulder internal rotation 4 4  Shoulder external rotation 4 4  Middle trapezius    Lower trapezius    Elbow flexion 4+ 4+  Elbow extension 4+ 4+   (Blank rows = not tested)  CERVICAL SPECIAL  TESTS:  Neck flexor muscle endurance test: 18.11 seconds, Spurling's test: Negative, and Distraction test: Negative  FUNCTIONAL TESTS:  Neck flexor muscle endurance test: 18.11 seconds   TREATMENT DATE: 11/07/23                                                                                                                              -HEP review, symptoms review -moist heat to neck 5 minutes  -seated AA/ROM scapulothoracic joints bilat UBE seat 8: 60sec F, 60 sec backwards, 60sec F, 60 sec backwards, -seated bilat cervical lateral flexion stretch 1x40sec each -seated bilat nose to axilla stretch 1x40sec each  -seated book openings 1x15 bilat in chair (paired with cervical rotation)  -seated repeated extension 1x15 (paired with  cervical extension)   -standing pushup off elevated plinth x15  -seated GTB low row, neutral ABDCT angle x15  -standing pushup off elevated plinth x15  -seated GTB low row, neutral ABDCT angle x15     PATIENT EDUCATION:  Education details: HEP, POC, goals  Person educated: Patient Education method: Explanation, Demonstration, and Handouts Education comprehension: verbalized understanding and returned demonstration  HOME EXERCISE PROGRAM: Access Code: [email or web address] URL:  [email or web address] Date: 11/02/2023 Prepared by: Maylon Peppers  Exercises - Seated Upper Trapezius Stretch  - 2-3 x daily - 5-7 x weekly - 3-5 reps - 30-60 seconds  hold - Seated Levator Scapulae Stretch  - 2-3 x daily - 5-7 x weekly - 3-5 reps - 30-60 seconds hold - Seated Shoulder Rolls  - 2-3 x daily - 5-7 x weekly - 10 reps  ASSESSMENT:  CLINICAL IMPRESSION: Reviewed HEP. Commenced ROM and low-moderate loading interventions today and drawing in adjacent regions of concern. Generally good tolerance overall in session, no pain exacerbation, no fatigue, low effort. Minimal cuing needed. Focus on thoracic mobility today, particularly with paired functional movements that include cervical movements. Patient will benefit from skilled PT interventions to address listed impairments to reduce pain and improve quality of life.   OBJECTIVE IMPAIRMENTS: decreased activity tolerance, decreased endurance, decreased strength, hypomobility, impaired flexibility, postural dysfunction, and pain.   ACTIVITY LIMITATIONS: sleeping and reach over head  PARTICIPATION LIMITATIONS: driving, shopping, and community activity  PERSONAL FACTORS: Age, Behavior pattern, and Time since onset of injury/illness/exacerbation are also affecting patient's functional outcome.   REHAB POTENTIAL: Good  CLINICAL DECISION MAKING: Stable/uncomplicated  EVALUATION COMPLEXITY: Moderate   GOALS: Goals reviewed with patient? Yes  SHORT TERM GOALS: Target date: 11/29/2023   Patient will be independent in HEP to improve strength/mobility for better functional independence with ADLs. Baseline: 2/13: HEP initiated  Goal status: INITIAL   LONG TERM GOALS: Target date: 12/27/2023  Patient will reduce Neck Disability Index score to <6% to demonstrate minimal disability with ADL's including improved sleeping tolerance, sitting tolerance, etc for better mobility at home and work. Baseline: 2/13: 10%  Goal  status: INITIAL  2.  Patient will report <3/10 pain at worst in neck on NRPS to demonstrate improved tolerance and ability to perform iADLs and other hobbies (I.e reading, carrying for grandchildren).   Baseline: 2/13: 6/10 Goal status: INITIAL  3.  Patient will improve B UE strength by 0.5 MMT to demonstrate improved tolerance to iADLs and improve overall functional strength for postural correction.  Baseline: 2/13: see above  Goal status: INITIAL  4. Patient will be able to read >45 minutes with improved mechanics and <2/10 pain in neck on NRPS to improve tolerance to participate in favorite hobby.  Baseline:  Goal status: INITIAL   PLAN:  PT FREQUENCY: 1-2x/week  PT DURATION: 8 weeks  PLANNED INTERVENTIONS: 97164- PT Re-evaluation, 97110-Therapeutic exercises, 97530- Therapeutic activity, 97112- Neuromuscular re-education, 97535- Self Care, 62130- Manual therapy, 97014- Electrical stimulation (unattended), [phone]- Electrical stimulation (manual), 97012- Traction (mechanical), Patient/Family education, Taping, Dry Needling, Joint mobilization, Joint manipulation, Spinal manipulation, Spinal mobilization, Cryotherapy, and Moist heat  PLAN FOR NEXT SESSION: HEP review, chin tucks, manual stretching/STM, updated HEP with strengthening since she is only coming 1x/week due to her schedule for watching her 3yo grandchild 2x weekly    10:38 AM, 11/07/23 Rosamaria Lints, PT, DPT Physical Therapist - [facility] in [place]  [phone] (Office)     11/07/2023, 10:38 AM

## 2023-11-09 ENCOUNTER — Encounter: Payer: Medicare PPO | Admitting: Physical Therapy

## 2023-11-14 ENCOUNTER — Encounter: Payer: Medicare PPO | Admitting: Physical Therapy

## 2023-11-15 NOTE — Therapy (Signed)
 OUTPATIENT PHYSICAL THERAPY TREATMENT   Patient Name: Melinda Barber MRN: [id number] DOB:[birth year], 68 y.o., female Today's Date: 11/16/2023  END OF SESSION:  PT End of Session - 11/16/23 0901     Visit Number 3    Number of Visits 17    Date for PT Re-Evaluation 12/28/23    Authorization Type [employer]    Progress Note Due on Visit 10    PT Start Time 0901    PT Stop Time [phone]    PT Time Calculation (min) 41 min    Activity Tolerance Patient tolerated treatment well;No increased pain    Behavior During Therapy WFL for tasks assessed/performed              Past Medical History:  Diagnosis Date   Carotid bruit    Complication of anesthesia    with 1st colonoscopy in [place] was "given too much anesthesia" and was loopy and had stomach issues for days-2nd colonoscopy no problems   Elevated hemoglobin (HCC)    GERD (gastroesophageal reflux disease)    occ no meds   Hyperbilirubinemia    Hypercholesterolemia    Hypertension    Inguinal hernia    Palpitations    Tremor    Past Surgical History:  Procedure Laterality Date   COLONOSCOPY     INGUINAL HERNIA REPAIR Right 07/25/2022   Procedure: HERNIA REPAIR INGUINAL ADULT;  Surgeon: Earline Mayotte, MD;  Location: [facility];  Service: General;  Laterality: Right;   INSERTION OF MESH Right 07/25/2022   Procedure: INSERTION OF MESH;  Surgeon: Earline Mayotte, MD;  Location: [facility];  Service: General;  Laterality: Right;   Patient Active Problem List   Diagnosis Date Noted   Ear fullness 10/01/2023   Neck pain 03/28/2023   Hip pain, right 03/28/2023   Breast cancer screening 05/15/2022   Carotid bruit 03/22/2022   Headache 03/22/2022   Abdominal pain 03/22/2022   Tremor 03/23/2020   Inguinal hernia 03/23/2020   Palpitations 03/17/2018   Stress 03/09/2015   Health care maintenance 11/10/2014   Hyperbilirubinemia 09/09/2012   Menopausal syndrome 09/09/2012   Hypertension 09/06/2012    Hypercholesterolemia 09/06/2012    PCP: Dale Howard, MD  REFERRING PROVIDER: Dale , MD  REFERRING DIAG: M54.2 (ICD-10-CM) - Neck pain  THERAPY DIAG:  Muscle weakness (generalized)  Muscle tightness  Neck pain  Rationale for Evaluation and Treatment: Rehabilitation  ONSET DATE: couple of years ago   SUBJECTIVE:  SUBJECTIVE STATEMENT:  PERTINENT HISTORY:  Patient reports 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 Pain is usually in the posterior neck but at times can extend to mid back. Does not limit her from daily activities. Patient bought a neck pillow to see if it will help but it did not. Patient reports she has headaches somewhat frequently but unsure if related to the neck or her new dogs' fur in the home.   Hand dominance: Right  Persistent neck pain for months and is aggravated with looking up.  PMH: head tremor (family hx of tremors), HTN, inguinal hernia (s/p repair 07/25/22)  PAIN: Yes 2/10, constant low level ache    PRECAUTIONS: None  RED FLAGS: Cervical red flags: None       WEIGHT BEARING RESTRICTIONS: No  FALLS:  Has patient fallen in last 6 months? No  LIVING ENVIRONMENT: Lives with: lives with their spouse  OCCUPATION: retired Runner, broadcasting/film/video   PLOF: Independent  PATIENT GOALS: to take the pain away   NEXT MD VISIT: 03/2024  OBJECTIVE:  Note: Objective measures were completed at Evaluation unless otherwise noted.  DIAGNOSTIC FINDINGS:  EXAM (03/28/23):  CERVICAL SPINE - 3 VIEW IMPRESSION: Degenerative changes. No acute osseous abnormalities.  PATIENT SURVEYS:  NDI 5/50 = 10%  COGNITION: Overall cognitive status: Within functional limits for tasks assessed  SENSATION: WFL  POSTURE: rounded shoulders and forward  head  PALPATION: TTP to suboccipitals, B UT (R>L), B levator scapulae Muscle tightness notable to R>L UT and B levator scapulae.    CERVICAL ROM:   Active ROM A/PROM (deg) eval  Flexion WFL  Extension WFL  Right lateral flexion WFL  Left lateral flexion WFL  Right rotation WFL  Left rotation WFL   (Blank rows = not tested)  UPPER EXTREMITY MMT:  MMT Right eval Left eval  Shoulder flexion 4 4  Shoulder extension    Shoulder abduction 4+ 4+  Shoulder adduction    Shoulder internal rotation 4 4  Shoulder external rotation 4 4  Middle trapezius    Lower trapezius    Elbow flexion 4+ 4+  Elbow extension 4+ 4+   (Blank rows = not tested)  CERVICAL SPECIAL  TESTS:  Neck flexor muscle endurance test: 18.11 seconds, Spurling's test: Negative, and Distraction test: Negative  FUNCTIONAL TESTS:  Neck flexor muscle endurance test: 18.11 seconds   TREATMENT DATE: 11/16/23         Subjective: Patient reports pain is improving and reports 1-2/10 pain in upper thoracic area  UBE 2.5 fwd/2.5 bwd to improve scapulothoracic mobility  Supine STM and cervical stretching to B UT, levator, paraspinals, and suboccipitals x 20 minutes  Prone CPAs C2-T4 2 x 20 second bouts  Standing row with GTB 2 x 15 Standing shoulder extension with GTB 2 x 15 Seated thoracic extension in chair x 5   HEP updated with rows, shoulder extension, horizontal abduction, and thoracic extension stretch and provided with GTB   PATIENT EDUCATION:  Education details: HEP, POC, goals  Person educated: Patient Education method: Explanation, Demonstration, and Handouts Education comprehension: verbalized understanding and returned demonstration  HOME EXERCISE PROGRAM: Access Code: [email or web address] URL: [email or web address] Date: 11/02/2023 Prepared by: Maylon Peppers  Exercises - Seated Upper Trapezius Stretch  - 2-3 x daily - 5-7 x weekly - 3-5 reps - 30-60 seconds  hold - Seated Levator Scapulae  Stretch  - 2-3 x daily - 5-7 x weekly - 3-5 reps - 30-60 seconds hold - Seated Shoulder Rolls  -  2-3 x daily - 5-7 x weekly - 10 reps - Seated Thoracic Lumbar Extension  - 2-3 x daily - 5-7 x weekly - 3 sets - 10 reps - Standing Shoulder Row with Anchored Resistance  - 2-3 x daily - 5-7 x weekly - 3 sets - 10 reps - Shoulder extension with resistance - Neutral  - 2-3 x daily - 5-7 x weekly - 3 sets - 10 reps - Standing Shoulder Horizontal Abduction with Resistance  - 2-3 x daily - 5-7 x weekly - 3 sets - 10 reps  ASSESSMENT:  CLINICAL IMPRESSION:  Session focused on manual stretching and STM as well as scapular strengthening. Updated HEP with the above and provided patient with GTB. Tolerated treatment sessio well and has been having reduced pain since starting PT. Patient will benefit from skilled PT interventions to address listed impairments to reduce pain and improve quality of life.   OBJECTIVE IMPAIRMENTS: decreased activity tolerance, decreased endurance, decreased strength, hypomobility, impaired flexibility, postural dysfunction, and pain.   ACTIVITY LIMITATIONS: sleeping and reach over head  PARTICIPATION LIMITATIONS: driving, shopping, and community activity  PERSONAL FACTORS: Age, Behavior pattern, and Time since onset of injury/illness/exacerbation are also affecting patient's functional outcome.   REHAB POTENTIAL: Good  CLINICAL DECISION MAKING: Stable/uncomplicated  EVALUATION COMPLEXITY: Moderate   GOALS: Goals reviewed with patient? Yes  SHORT TERM GOALS: Target date: 11/29/2023   Patient will be independent in HEP to improve strength/mobility for better functional independence with ADLs. Baseline: 2/13: HEP initiated  Goal status: INITIAL   LONG TERM GOALS: Target date: 12/27/2023  Patient will reduce Neck Disability Index score to <6% to demonstrate minimal disability with ADL's including improved sleeping tolerance, sitting tolerance, etc for better mobility  at home and work. Baseline: 2/13: 10%  Goal status: INITIAL  2.  Patient will report <3/10 pain at worst in neck on NRPS to demonstrate improved tolerance and ability to perform iADLs and other hobbies (I.e reading, carrying for grandchildren).   Baseline: 2/13: 6/10 Goal status: INITIAL  3.  Patient will improve B UE strength by 0.5 MMT to demonstrate improved tolerance to iADLs and improve overall functional strength for postural correction.  Baseline: 2/13: see above  Goal status: INITIAL  4. Patient will be able to read >45 minutes with improved mechanics and <2/10 pain in neck on NRPS to improve tolerance to participate in favorite hobby.  Baseline:  Goal status: INITIAL   PLAN:  PT FREQUENCY: 1-2x/week  PT DURATION: 8 weeks  PLANNED INTERVENTIONS: 97164- PT Re-evaluation, 97110-Therapeutic exercises, 97530- Therapeutic activity, 97112- Neuromuscular re-education, 97535- Self Care, 13086- Manual therapy, 97014- Electrical stimulation (unattended), [phone]- Electrical stimulation (manual), [id number]- Traction (mechanical), Patient/Family education, Taping, Dry Needling, Joint mobilization, Joint manipulation, Spinal manipulation, Spinal mobilization, Cryotherapy, and Moist heat  PLAN FOR NEXT SESSION: HEP review, chin tucks, manual stretching/STM, updated HEP with strengthening since she is only coming 1x/week due to her schedule for watching her 3yo grandchild 2x weekly    Maylon Peppers, PT, DPT Physical Therapist - [facility]   11/16/2023, 9:02 AM

## 2023-11-16 ENCOUNTER — Ambulatory Visit: Payer: Medicare PPO

## 2023-11-16 ENCOUNTER — Encounter: Payer: Self-pay | Admitting: Physical Therapy

## 2023-11-16 DIAGNOSIS — M6281 Muscle weakness (generalized): Secondary | ICD-10-CM | POA: Diagnosis not present

## 2023-11-16 DIAGNOSIS — M542 Cervicalgia: Secondary | ICD-10-CM

## 2023-11-16 DIAGNOSIS — M6289 Other specified disorders of muscle: Secondary | ICD-10-CM

## 2023-11-21 ENCOUNTER — Ambulatory Visit: Payer: Medicare PPO | Attending: Internal Medicine | Admitting: Physical Therapy

## 2023-11-21 DIAGNOSIS — M6289 Other specified disorders of muscle: Secondary | ICD-10-CM | POA: Diagnosis not present

## 2023-11-21 DIAGNOSIS — M542 Cervicalgia: Secondary | ICD-10-CM

## 2023-11-21 DIAGNOSIS — M6281 Muscle weakness (generalized): Secondary | ICD-10-CM

## 2023-11-21 NOTE — Therapy (Signed)
 OUTPATIENT PHYSICAL THERAPY TREATMENT   Patient Name: Melinda Barber MRN: [id number] DOB:Sep 27, 1955, 68 y.o., female Today's Date: 11/21/2023  END OF SESSION:  PT End of Session - 11/21/23 0859     Visit Number 4    Number of Visits 17    Date for PT Re-Evaluation 12/28/23    Authorization Type [employer]    Progress Note Due on Visit 10    PT Start Time 0859    PT Stop Time 0940    PT Time Calculation (min) 41 min    Activity Tolerance Patient tolerated treatment well;No increased pain    Behavior During Therapy WFL for tasks assessed/performed               Past Medical History:  Diagnosis Date   Carotid bruit    Complication of anesthesia    with 1st colonoscopy in [place] was "given too much anesthesia" and was loopy and had stomach issues for days-2nd colonoscopy no problems   Elevated hemoglobin (HCC)    GERD (gastroesophageal reflux disease)    occ no meds   Hyperbilirubinemia    Hypercholesterolemia    Hypertension    Inguinal hernia    Palpitations    Tremor    Past Surgical History:  Procedure Laterality Date   COLONOSCOPY     INGUINAL HERNIA REPAIR Right 07/25/2022   Procedure: HERNIA REPAIR INGUINAL ADULT;  Surgeon: Earline Mayotte, MD;  Location: [facility];  Service: General;  Laterality: Right;   INSERTION OF MESH Right 07/25/2022   Procedure: INSERTION OF MESH;  Surgeon: Earline Mayotte, MD;  Location: [facility];  Service: General;  Laterality: Right;   Patient Active Problem List   Diagnosis Date Noted   Ear fullness 10/01/2023   Neck pain 03/28/2023   Hip pain, right 03/28/2023   Breast cancer screening 05/15/2022   Carotid bruit 03/22/2022   Headache 03/22/2022   Abdominal pain 03/22/2022   Tremor 03/23/2020   Inguinal hernia 03/23/2020   Palpitations 03/17/2018   Stress 03/09/2015   Health care maintenance 11/10/2014   Hyperbilirubinemia 09/09/2012   Menopausal syndrome 09/09/2012   Hypertension 09/06/2012    Hypercholesterolemia 09/06/2012    PCP: Dale Deerfield, MD  REFERRING PROVIDER: Dale O'Brien, MD  REFERRING DIAG: M54.2 (ICD-10-CM) - Neck pain  THERAPY DIAG:  Muscle weakness (generalized)  Muscle tightness  Neck pain  Rationale for Evaluation and Treatment: Rehabilitation  ONSET DATE: couple of years ago   SUBJECTIVE:  PERTINENT HISTORY:  Patient reports 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 Pain is usually in the posterior neck but at times can extend to mid back. Does not limit her from daily activities. Patient bought a neck pillow to see if it will help but it did not. Patient reports she has headaches somewhat frequently but unsure if related to the neck or her new dogs' fur in the home.   Hand dominance: Right  Persistent neck pain for months and is aggravated with looking up.  PMH: head tremor (family hx of tremors), HTN, inguinal hernia (s/p repair 07/25/22)  PAIN: Yes 2/10, constant low level ache    PRECAUTIONS: None  RED FLAGS: Cervical red flags: None       WEIGHT BEARING RESTRICTIONS: No  FALLS:  Has patient fallen in last 6 months? No  LIVING ENVIRONMENT: Lives with: lives with their spouse  OCCUPATION: retired Runner, broadcasting/film/video   PLOF: Independent  PATIENT GOALS: to take the pain away   NEXT MD VISIT: 03/2024  OBJECTIVE:  Note: Objective measures were completed at Evaluation unless otherwise noted.  DIAGNOSTIC FINDINGS:  EXAM (03/28/23):  CERVICAL SPINE - 3 VIEW IMPRESSION: Degenerative changes. No acute osseous abnormalities.  PATIENT SURVEYS:  NDI 5/50 = 10%  COGNITION: Overall cognitive status: Within functional limits for tasks assessed  SENSATION: WFL  POSTURE: rounded shoulders and forward head  PALPATION: TTP to  suboccipitals, B UT (R>L), B levator scapulae Muscle tightness notable to R>L UT and B levator scapulae.    CERVICAL ROM:   Active ROM A/PROM (deg) eval  Flexion WFL  Extension WFL  Right lateral flexion WFL  Left lateral flexion WFL  Right rotation WFL  Left rotation WFL   (Blank rows = not tested)  UPPER EXTREMITY MMT:  MMT Right eval Left eval  Shoulder flexion 4 4  Shoulder extension    Shoulder abduction 4+ 4+  Shoulder adduction    Shoulder internal rotation 4 4  Shoulder external rotation 4 4  Middle trapezius    Lower trapezius    Elbow flexion 4+ 4+  Elbow extension 4+ 4+   (Blank rows = not tested)  CERVICAL SPECIAL  TESTS:  Neck flexor muscle endurance test: 18.11 seconds, Spurling's test: Negative, and Distraction test: Negative  FUNCTIONAL TESTS:  Neck flexor muscle endurance test: 18.11 seconds   TREATMENT DATE: 11/21/23         Subjective: Patient reports pain is improving and reports 3/10 pain in C-spine and R periscapular area. Patient denies numbness/tingling. Pt reports doing well with home exercises.    Supine cervical traction; 10 sec bouts, intermittent; x 3 minute Supine STM/IASTM (Hypervolt) and TPR to R>L UT, levator, periscapular mm; x 20 minutes  Prone CPAs C5-T1 2 x 30 second bouts/level  Seated scapular rolls, posterior; x 20 Standing row with BlueTB; 2 x 10 Standing horizontal abduction, Green Tband; 2x10  Reviewed cervical self-traction technique  HEP updated to include self-traction prn (see MedBridge update below). Pt provided with Blue Tband to enable exercise progression at home.    *not today* UBE 2.5 fwd/2.5 bwd to improve scapulothoracic mobility Standing shoulder extension with GTB 2 x 15 Seated thoracic extension in chair x 5    PATIENT EDUCATION:  Education details: HEP, POC, goals  Person educated: Patient Education method: Explanation, Demonstration, and Handouts Education comprehension: verbalized  understanding and returned demonstration  HOME EXERCISE PROGRAM: Access Code: [email or web address] URL: [email or web address] Date: 11/02/2023 Prepared by: Maylon Peppers  Exercises -  Seated Upper Trapezius Stretch  - 2-3 x daily - 5-7 x weekly - 3-5 reps - 30-60 seconds  hold - Seated Levator Scapulae Stretch  - 2-3 x daily - 5-7 x weekly - 3-5 reps - 30-60 seconds hold - Seated Shoulder Rolls  - 2-3 x daily - 5-7 x weekly - 10 reps - Seated Thoracic Lumbar Extension  - 2-3 x daily - 5-7 x weekly - 3 sets - 10 reps - Standing Shoulder Row with Anchored Resistance  - 2-3 x daily - 5-7 x weekly - 3 sets - 10 reps - Shoulder extension with resistance - Neutral  - 2-3 x daily - 5-7 x weekly - 3 sets - 10 reps - Standing Shoulder Horizontal Abduction with Resistance  - 2-3 x daily - 5-7 x weekly - 3 sets - 10 reps  Access Code: [email or web address] URL: [email or web address] Date: 11/21/2023 Prepared by: Consuela Mimes  Exercises - Seated Self Cervical Traction  - 7 x weekly - 10-20 reps - 5-10sec hold  ASSESSMENT:  CLINICAL IMPRESSION:  Pt was given HEP update to include self-traction prn and was provided with resistance band to progress intensity of exercise at home. Pt responds well to manual intervention and gives positive appraisal for PT to date. Pt exhibits grossly WFL cervical spine AROM, with only mild R-sided tension/"knot" sensation with end-range extension. Patient will benefit from skilled PT interventions to address listed impairments to reduce pain and improve quality of life.   OBJECTIVE IMPAIRMENTS: decreased activity tolerance, decreased endurance, decreased strength, hypomobility, impaired flexibility, postural dysfunction, and pain.   ACTIVITY LIMITATIONS: sleeping and reach over head  PARTICIPATION LIMITATIONS: driving, shopping, and community activity  PERSONAL FACTORS: Age, Behavior pattern, and Time since onset of injury/illness/exacerbation are also  affecting patient's functional outcome.   REHAB POTENTIAL: Good  CLINICAL DECISION MAKING: Stable/uncomplicated  EVALUATION COMPLEXITY: Moderate   GOALS: Goals reviewed with patient? Yes  SHORT TERM GOALS: Target date: 11/29/2023   Patient will be independent in HEP to improve strength/mobility for better functional independence with ADLs. Baseline: 2/13: HEP initiated  Goal status: INITIAL   LONG TERM GOALS: Target date: 12/27/2023  Patient will reduce Neck Disability Index score to <6% to demonstrate minimal disability with ADL's including improved sleeping tolerance, sitting tolerance, etc for better mobility at home and work. Baseline: 2/13: 10%  Goal status: INITIAL  2.  Patient will report <3/10 pain at worst in neck on NRPS to demonstrate improved tolerance and ability to perform iADLs and other hobbies (I.e reading, carrying for grandchildren).   Baseline: 2/13: 6/10 Goal status: INITIAL  3.  Patient will improve B UE strength by 0.5 MMT to demonstrate improved tolerance to iADLs and improve overall functional strength for postural correction.  Baseline: 2/13: see above  Goal status: INITIAL  4. Patient will be able to read >45 minutes with improved mechanics and <2/10 pain in neck on NRPS to improve tolerance to participate in favorite hobby.  Baseline:  Goal status: INITIAL   PLAN:  PT FREQUENCY: 1-2x/week  PT DURATION: 8 weeks  PLANNED INTERVENTIONS: 97164- PT Re-evaluation, 97110-Therapeutic exercises, 97530- Therapeutic activity, 97112- Neuromuscular re-education, 97535- Self Care, 16109- Manual therapy, 97014- Electrical stimulation (unattended), [id number]- Electrical stimulation (manual), [id number]- Traction (mechanical), Patient/Family education, Taping, Dry Needling, Joint mobilization, Joint manipulation, Spinal manipulation, Spinal mobilization, Cryotherapy, and Moist heat  PLAN FOR NEXT SESSION: HEP review, chin tucks, manual stretching/STM, updated HEP with  strengthening since she is only coming 1x/week due to her schedule for watching  her 3yo grandchild 2x weekly   Consuela Mimes, PT, [place] #[id number] Physical Therapist - [facility]   11/21/2023, 8:59 AM

## 2023-11-23 ENCOUNTER — Encounter: Payer: Medicare PPO | Admitting: Physical Therapy

## 2023-11-28 ENCOUNTER — Encounter: Payer: Medicare PPO | Admitting: Physical Therapy

## 2023-11-30 ENCOUNTER — Ambulatory Visit: Payer: Medicare PPO | Admitting: Physical Therapy

## 2023-11-30 ENCOUNTER — Encounter: Payer: Self-pay | Admitting: Physical Therapy

## 2023-11-30 DIAGNOSIS — M1990 Unspecified osteoarthritis, unspecified site: Secondary | ICD-10-CM | POA: Diagnosis not present

## 2023-11-30 DIAGNOSIS — M542 Cervicalgia: Secondary | ICD-10-CM

## 2023-11-30 DIAGNOSIS — Z8249 Family history of ischemic heart disease and other diseases of the circulatory system: Secondary | ICD-10-CM | POA: Diagnosis not present

## 2023-11-30 DIAGNOSIS — M6281 Muscle weakness (generalized): Secondary | ICD-10-CM

## 2023-11-30 DIAGNOSIS — I10 Essential (primary) hypertension: Secondary | ICD-10-CM | POA: Diagnosis not present

## 2023-11-30 DIAGNOSIS — M6289 Other specified disorders of muscle: Secondary | ICD-10-CM | POA: Diagnosis not present

## 2023-11-30 DIAGNOSIS — E785 Hyperlipidemia, unspecified: Secondary | ICD-10-CM | POA: Diagnosis not present

## 2023-11-30 DIAGNOSIS — Z833 Family history of diabetes mellitus: Secondary | ICD-10-CM | POA: Diagnosis not present

## 2023-11-30 DIAGNOSIS — Z87891 Personal history of nicotine dependence: Secondary | ICD-10-CM | POA: Diagnosis not present

## 2023-11-30 NOTE — Therapy (Signed)
 OUTPATIENT PHYSICAL THERAPY TREATMENT   Patient Name: Melinda Barber MRN: [id number] DOB:May 12, 1956, 68 y.o., female Today's Date: 11/30/2023  END OF SESSION:  PT End of Session - 11/30/23 0907     Visit Number 5    Number of Visits 17    Date for PT Re-Evaluation 12/28/23    Authorization Type [employer]    Progress Note Due on Visit 10    PT Start Time 0906    PT Stop Time 0945    PT Time Calculation (min) 39 min    Activity Tolerance Patient tolerated treatment well;No increased pain    Behavior During Therapy WFL for tasks assessed/performed                Past Medical History:  Diagnosis Date   Carotid bruit    Complication of anesthesia    with 1st colonoscopy in [place] was "given too much anesthesia" and was loopy and had stomach issues for days-2nd colonoscopy no problems   Elevated hemoglobin (HCC)    GERD (gastroesophageal reflux disease)    occ no meds   Hyperbilirubinemia    Hypercholesterolemia    Hypertension    Inguinal hernia    Palpitations    Tremor    Past Surgical History:  Procedure Laterality Date   COLONOSCOPY     INGUINAL HERNIA REPAIR Right 07/25/2022   Procedure: HERNIA REPAIR INGUINAL ADULT;  Surgeon: Earline Mayotte, MD;  Location: [facility];  Service: General;  Laterality: Right;   INSERTION OF MESH Right 07/25/2022   Procedure: INSERTION OF MESH;  Surgeon: Earline Mayotte, MD;  Location: [facility];  Service: General;  Laterality: Right;   Patient Active Problem List   Diagnosis Date Noted   Ear fullness 10/01/2023   Neck pain 03/28/2023   Hip pain, right 03/28/2023   Breast cancer screening 05/15/2022   Carotid bruit 03/22/2022   Headache 03/22/2022   Abdominal pain 03/22/2022   Tremor 03/23/2020   Inguinal hernia 03/23/2020   Palpitations 03/17/2018   Stress 03/09/2015   Health care maintenance 11/10/2014   Hyperbilirubinemia 09/09/2012   Menopausal syndrome 09/09/2012   Hypertension 09/06/2012    Hypercholesterolemia 09/06/2012    PCP: Dale Aristocrat [place], MD  REFERRING PROVIDER: Dale [place], MD  REFERRING DIAG: M54.2 (ICD-10-CM) - Neck pain  THERAPY DIAG:  Muscle weakness (generalized)  Muscle tightness  Neck pain  Rationale for Evaluation and Treatment: Rehabilitation  ONSET DATE: couple of years ago   SUBJECTIVE:  PERTINENT HISTORY:  Patient reports 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 Pain is usually in the posterior neck but at times can extend to mid back. Does not limit her from daily activities. Patient bought a neck pillow to see if it will help but it did not. Patient reports she has headaches somewhat frequently but unsure if related to the neck or her new dogs' fur in the home.   Hand dominance: Right  Persistent neck pain for months and is aggravated with looking up.  PMH: head tremor (family hx of tremors), HTN, inguinal hernia (s/p repair 07/25/22)  PAIN: Yes 2/10, constant low level ache    PRECAUTIONS: None  RED FLAGS: Cervical red flags: None       WEIGHT BEARING RESTRICTIONS: No  FALLS:  Has patient fallen in last 6 months? No  LIVING ENVIRONMENT: Lives with: lives with their spouse  OCCUPATION: retired Runner, broadcasting/film/video   PLOF: Independent  PATIENT GOALS: to take the pain away   NEXT MD VISIT: 03/2024  OBJECTIVE:  Note: Objective measures were completed at Evaluation unless otherwise noted.  DIAGNOSTIC FINDINGS:  EXAM (03/28/23):  CERVICAL SPINE - 3 VIEW IMPRESSION: Degenerative changes. No acute osseous abnormalities.  PATIENT SURVEYS:  NDI 5/50 = 10%  COGNITION: Overall cognitive status: Within functional limits for tasks assessed  SENSATION: WFL  POSTURE: rounded shoulders and forward head  PALPATION: TTP to  suboccipitals, B UT (R>L), B levator scapulae Muscle tightness notable to R>L UT and B levator scapulae.    CERVICAL ROM:   Active ROM A/PROM (deg) eval  Flexion WFL  Extension WFL  Right lateral flexion WFL  Left lateral flexion WFL  Right rotation WFL  Left rotation WFL   (Blank rows = not tested)  UPPER EXTREMITY MMT:  MMT Right eval Left eval  Shoulder flexion 4 4  Shoulder extension    Shoulder abduction 4+ 4+  Shoulder adduction    Shoulder internal rotation 4 4  Shoulder external rotation 4 4  Middle trapezius    Lower trapezius    Elbow flexion 4+ 4+  Elbow extension 4+ 4+   (Blank rows = not tested)  CERVICAL SPECIAL  TESTS:  Neck flexor muscle endurance test: 18.11 seconds, Spurling's test: Negative, and Distraction test: Negative  FUNCTIONAL TESTS:  Neck flexor muscle endurance test: 18.11 seconds   TREATMENT DATE: 11/30/23         Subjective: Patient reports some pain into R upper trapezius region, intermittently into R periscapular region. Pt reports periscapular pain is not as bad as last week. Pt is having some HA symptoms due to allergies.     UBE 2 fwd/2 bwd to improve scapulothoracic mobility  -interval history gathered during this time   Supine cervical traction; 10 sec bouts, intermittent; x 3 minute Supine STM/IASTM (Hypervolt) and TPR to R>L UT, levator, periscapular mm; x 20 minutes   -Full C-spine AROM exhibited with mild motion loss into extension; no pain reproduction with AROM  Seated sidebend stretch; 2 x 30 sec  Standing horizontal abduction, Green Tband; 2x10  Reviewed cervical self-traction technique   PATIENT EDUCATION: Discussed ongoing HEP and PT POC.    *not today* Standing row with BlueTB; 2 x 10 Prone CPAs C5-T1 2 x 30 second bouts/level Seated scapular rolls, posterior; x 20 Standing shoulder extension with GTB 2 x 15 Seated thoracic extension in chair x 5    PATIENT EDUCATION:  Education details: HEP, POC,  goals  Person educated: Patient  Education method: Explanation, Demonstration, and Handouts Education comprehension: verbalized understanding and returned demonstration  HOME EXERCISE PROGRAM: Access Code: [email or web address] URL: [email or web address] Date: 11/02/2023 Prepared by: Maylon Peppers  Exercises - Seated Upper Trapezius Stretch  - 2-3 x daily - 5-7 x weekly - 3-5 reps - 30-60 seconds  hold - Seated Levator Scapulae Stretch  - 2-3 x daily - 5-7 x weekly - 3-5 reps - 30-60 seconds hold - Seated Shoulder Rolls  - 2-3 x daily - 5-7 x weekly - 10 reps - Seated Thoracic Lumbar Extension  - 2-3 x daily - 5-7 x weekly - 3 sets - 10 reps - Standing Shoulder Row with Anchored Resistance  - 2-3 x daily - 5-7 x weekly - 3 sets - 10 reps - Shoulder extension with resistance - Neutral  - 2-3 x daily - 5-7 x weekly - 3 sets - 10 reps - Standing Shoulder Horizontal Abduction with Resistance  - 2-3 x daily - 5-7 x weekly - 3 sets - 10 reps  Access Code: [email or web address] URL: [email or web address] Date: 11/21/2023 Prepared by: Consuela Mimes  Exercises - Seated Self Cervical Traction  - 7 x weekly - 10-20 reps - 5-10sec hold  ASSESSMENT:  CLINICAL IMPRESSION:  Pt fortunately demonstrates WFL C-spine AROM without reproduction of symptoms today; she has very mild extension motion loss without concordant pain. Patient has responded well with conservative intervention and is doing well with postural re-edu at home. Pt does not exhibit need for additional cervical mobilization work given ROM findings today. Patient will benefit from skilled PT interventions to address listed impairments to reduce pain and improve quality of life.   OBJECTIVE IMPAIRMENTS: decreased activity tolerance, decreased endurance, decreased strength, hypomobility, impaired flexibility, postural dysfunction, and pain.   ACTIVITY LIMITATIONS: sleeping and reach over head  PARTICIPATION LIMITATIONS: driving,  shopping, and community activity  PERSONAL FACTORS: Age, Behavior pattern, and Time since onset of injury/illness/exacerbation are also affecting patient's functional outcome.   REHAB POTENTIAL: Good  CLINICAL DECISION MAKING: Stable/uncomplicated  EVALUATION COMPLEXITY: Moderate   GOALS: Goals reviewed with patient? Yes  SHORT TERM GOALS: Target date: 11/29/2023   Patient will be independent in HEP to improve strength/mobility for better functional independence with ADLs. Baseline: 2/13: HEP initiated  Goal status: INITIAL   LONG TERM GOALS: Target date: 12/27/2023  Patient will reduce Neck Disability Index score to <6% to demonstrate minimal disability with ADL's including improved sleeping tolerance, sitting tolerance, etc for better mobility at home and work. Baseline: 2/13: 10%  Goal status: INITIAL  2.  Patient will report <3/10 pain at worst in neck on NRPS to demonstrate improved tolerance and ability to perform iADLs and other hobbies (I.e reading, carrying for grandchildren).   Baseline: 2/13: 6/10 Goal status: INITIAL  3.  Patient will improve B UE strength by 0.5 MMT to demonstrate improved tolerance to iADLs and improve overall functional strength for postural correction.  Baseline: 2/13: see above  Goal status: INITIAL  4. Patient will be able to read >45 minutes with improved mechanics and <2/10 pain in neck on NRPS to improve tolerance to participate in favorite hobby.  Baseline:  Goal status: INITIAL   PLAN:  PT FREQUENCY: 1-2x/week  PT DURATION: 8 weeks  PLANNED INTERVENTIONS: 97164- PT Re-evaluation, 97110-Therapeutic exercises, 97530- Therapeutic activity, 97112- Neuromuscular re-education, 97535- Self Care, 54098- Manual therapy, 97014- Electrical stimulation (unattended), [id number]- Electrical stimulation (manual), [id number]- Traction (mechanical), Patient/Family education, Taping, Dry Needling, Joint mobilization, Joint manipulation, Spinal manipulation,  Spinal mobilization, Cryotherapy,  and Moist heat  PLAN FOR NEXT SESSION: HEP review, chin tucks, manual stretching/STM, updated HEP with strengthening since she is only coming 1x/week due to her schedule for watching her 3yo grandchild 2x weekly   Consuela Mimes, PT, DPT [phone] Physical Therapist - [facility]   11/30/2023, 9:07 AM

## 2023-12-05 ENCOUNTER — Ambulatory Visit: Payer: Medicare PPO | Admitting: Physical Therapy

## 2023-12-05 DIAGNOSIS — M542 Cervicalgia: Secondary | ICD-10-CM | POA: Diagnosis not present

## 2023-12-05 DIAGNOSIS — M6289 Other specified disorders of muscle: Secondary | ICD-10-CM

## 2023-12-05 DIAGNOSIS — M6281 Muscle weakness (generalized): Secondary | ICD-10-CM | POA: Diagnosis not present

## 2023-12-05 NOTE — Therapy (Unsigned)
 OUTPATIENT PHYSICAL THERAPY TREATMENT/GOAL UPDATE   Patient Name: Melinda Barber MRN: [id number] DOB:[birth year], 68 y.o., female Today's Date: 12/05/2023  END OF SESSION:  PT End of Session - 12/05/23 0910     Visit Number 6    Number of Visits 17    Date for PT Re-Evaluation 12/28/23    Authorization Type [employer]    Progress Note Due on Visit 10    PT Start Time 0908    PT Stop Time 0947    PT Time Calculation (min) 39 min    Activity Tolerance Patient tolerated treatment well;No increased pain    Behavior During Therapy WFL for tasks assessed/performed                 Past Medical History:  Diagnosis Date   Carotid bruit    Complication of anesthesia    with 1st colonoscopy in [place] was "given too much anesthesia" and was loopy and had stomach issues for days-2nd colonoscopy no problems   Elevated hemoglobin (HCC)    GERD (gastroesophageal reflux disease)    occ no meds   Hyperbilirubinemia    Hypercholesterolemia    Hypertension    Inguinal hernia    Palpitations    Tremor    Past Surgical History:  Procedure Laterality Date   COLONOSCOPY     INGUINAL HERNIA REPAIR Right 07/25/2022   Procedure: HERNIA REPAIR INGUINAL ADULT;  Surgeon: Earline Mayotte, MD;  Location: [facility];  Service: General;  Laterality: Right;   INSERTION OF MESH Right 07/25/2022   Procedure: INSERTION OF MESH;  Surgeon: Earline Mayotte, MD;  Location: [facility];  Service: General;  Laterality: Right;   Patient Active Problem List   Diagnosis Date Noted   Ear fullness 10/01/2023   Neck pain 03/28/2023   Hip pain, right 03/28/2023   Breast cancer screening 05/15/2022   Carotid bruit 03/22/2022   Headache 03/22/2022   Abdominal pain 03/22/2022   Tremor 03/23/2020   Inguinal hernia 03/23/2020   Palpitations 03/17/2018   Stress 03/09/2015   Health care maintenance 11/10/2014   Hyperbilirubinemia 09/09/2012   Menopausal syndrome 09/09/2012   Hypertension  09/06/2012   Hypercholesterolemia 09/06/2012    PCP: Dale Betances, MD  REFERRING PROVIDER: Dale Clyde, MD  REFERRING DIAG: M54.2 (ICD-10-CM) - Neck pain  THERAPY DIAG:  Muscle weakness (generalized)  Muscle tightness  Neck pain  Rationale for Evaluation and Treatment: Rehabilitation  ONSET DATE: couple of years ago   SUBJECTIVE:  PERTINENT HISTORY:  Patient reports 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 Pain is usually in the posterior neck but at times can extend to mid back. Does not limit her from daily activities. Patient bought a neck pillow to see if it will help but it did not. Patient reports she has headaches somewhat frequently but unsure if related to the neck or her new dogs' fur in the home.   Hand dominance: Right  Persistent neck pain for months and is aggravated with looking up.  PMH: head tremor (family hx of tremors), HTN, inguinal hernia (s/p repair 07/25/22)  PAIN: Yes 2/10, constant low level ache    PRECAUTIONS: None  RED FLAGS: Cervical red flags: None       WEIGHT BEARING RESTRICTIONS: No  FALLS:  Has patient fallen in last 6 months? No  LIVING ENVIRONMENT: Lives with: lives with their spouse  OCCUPATION: retired Runner, broadcasting/film/video   PLOF: Independent  PATIENT GOALS: to take the pain away   NEXT MD VISIT: 03/2024  OBJECTIVE:  Note: Objective measures were completed at Evaluation unless otherwise noted.  DIAGNOSTIC FINDINGS:  EXAM (03/28/23):  CERVICAL SPINE - 3 VIEW IMPRESSION: Degenerative changes. No acute osseous abnormalities.  PATIENT SURVEYS:  NDI 5/50 = 10%  COGNITION: Overall cognitive status: Within functional limits for tasks assessed  SENSATION: WFL  POSTURE: rounded shoulders and forward  head  PALPATION: TTP to suboccipitals, B UT (R>L), B levator scapulae Muscle tightness notable to R>L UT and B levator scapulae.    CERVICAL ROM:   Active ROM A/PROM (deg) eval  Flexion WFL  Extension WFL  Right lateral flexion WFL  Left lateral flexion WFL  Right rotation WFL  Left rotation WFL   (Blank rows = not tested)  UPPER EXTREMITY MMT:  MMT Right eval Left eval Right 12/05/23 Left 12/05/23  Shoulder flexion 4 4 4+ 4+  Shoulder extension      Shoulder abduction 4+ 4+ 5 5  Shoulder adduction      Shoulder internal rotation 4 4 5 5   Shoulder external rotation 4 4 4+ 4+  Middle trapezius      Lower trapezius      Elbow flexion 4+ 4+ 5 5  Elbow extension 4+ 4+ 4+ 5   (Blank rows = not tested)  CERVICAL SPECIAL  TESTS:  Neck flexor muscle endurance test: 18.11 seconds, Spurling's test: Negative, and Distraction test: Negative  FUNCTIONAL TESTS:  Neck flexor muscle endurance test: 18.11 seconds     TREATMENT DATE: 12/05/23         Subjective: Patient reports some tenderness/soreness remaining in L paracervical region after flare-up Saturday morning. Patient reports no N/T. No unusual activity Friday. Pt experienced 5-6/10 pain Saturday during flare-up; she has minimal pain during session today. Today is last scheduled visit. Pt has made good progress to date and feels she has benefited from PT.     Supine cervical traction; 10 sec bouts, intermittent; x 3 minute Supine STM/IASTM (Hypervolt) and TPR to R>L UT, levator, periscapular mm; x 20 minutes   *GOAL UPDATE PERFORMED    -Full C-spine AROM exhibited with mild motion loss into extension; no pain reproduction with AROM  Seated sidebend stretch; 2 x 30 sec    PATIENT EDUCATION: Discussed current progress with PT, options moving forward with trial of HEP versus tapered PT frequency    *not today* Standing horizontal abduction, Green Tband; 2x10 Standing row with BlueTB; 2 x 10 Prone CPAs C5-T1 2 x  30 second bouts/level Seated scapular rolls, posterior; x 20 Standing shoulder extension with GTB 2 x 15 Seated thoracic extension in chair x 5    PATIENT EDUCATION:  Education details: HEP, POC, goals  Person educated: Patient Education method: Explanation, Demonstration, and Handouts Education comprehension: verbalized understanding and returned demonstration  HOME EXERCISE PROGRAM: Access Code: [email or web address] URL: [email or web address] Date: 11/02/2023 Prepared by: Maylon Peppers  Exercises - Seated Upper Trapezius Stretch  - 2-3 x daily - 5-7 x weekly - 3-5 reps - 30-60 seconds  hold - Seated Levator Scapulae Stretch  - 2-3 x daily - 5-7 x weekly - 3-5 reps - 30-60 seconds hold - Seated Shoulder Rolls  - 2-3 x daily - 5-7 x weekly - 10 reps - Seated Thoracic Lumbar Extension  - 2-3 x daily - 5-7 x weekly - 3 sets - 10 reps - Standing Shoulder Row with Anchored Resistance  - 2-3 x daily - 5-7 x weekly - 3 sets - 10 reps - Shoulder extension with resistance - Neutral  - 2-3 x daily - 5-7 x weekly - 3 sets - 10 reps - Standing Shoulder Horizontal Abduction with Resistance  - 2-3 x daily - 5-7 x weekly - 3 sets - 10 reps  Access Code: [email or web address] URL: [email or web address] Date: 11/21/2023 Prepared by: Consuela Mimes  Exercises - Seated Self Cervical Traction  - 7 x weekly - 10-20 reps - 5-10sec hold  ASSESSMENT:  CLINICAL IMPRESSION:  Pt has made good progress with PT and has exhibited WFL cervical spine AROM. She had flare-up Saturday morning without specific aggravating factor/event, but this has fortunately improved since then. Pt has largely reported minimal pain at arrival to PT over previous 3 visits. She has improved NDI, though she is short of long-term goal given low disability at baseline. She has achieved strength goal and reading goal at this time. She has not met NPRS goal due to outlier event this past weekend, but she has made notable progress  with reports of upper quarter pain. Per conversation with pt today, she feels comfortable with continuing with trial of HEP and may f/u with PT as needed if experiencing significant regression or failure to continue with her current progress.    OBJECTIVE IMPAIRMENTS: decreased activity tolerance, decreased endurance, decreased strength, hypomobility, impaired flexibility, postural dysfunction, and pain.   ACTIVITY LIMITATIONS: sleeping and reach over head  PARTICIPATION LIMITATIONS: driving, shopping, and community activity  PERSONAL FACTORS: Age, Behavior pattern, and Time since onset of injury/illness/exacerbation are also affecting patient's functional outcome.   REHAB POTENTIAL: Good  CLINICAL DECISION MAKING: Stable/uncomplicated  EVALUATION COMPLEXITY: Moderate   GOALS: Goals reviewed with patient? Yes  SHORT TERM GOALS: Target date: 11/29/2023   Patient will be independent in HEP to improve strength/mobility for better functional independence with ADLs. Baseline: 2/13: HEP initiated   12/05/23: Compliant and IND.  Goal status: ACHIEVED   LONG TERM GOALS: Target date: 12/27/2023  Patient will reduce Neck Disability Index score to <6% to demonstrate minimal disability with ADL's including improved sleeping tolerance, sitting tolerance, etc for better mobility at home and work. Baseline: 2/13: 10%   12/05/23: 3/50 = 6% Goal status: MOSTLY MET   2.  Patient will report <3/10 pain at worst in neck on NRPS to demonstrate improved tolerance and ability to perform iADLs and other hobbies (I.e reading, carrying for grandchildren).   Baseline: 2/13: 6/10.     12/05/23: 5-6/10 at worst on Saturday; minimal pain presently.  Goal status: NOT MET  3.  Patient will improve B UE strength by 0.5 MMT to demonstrate improved tolerance to iADLs and improve overall functional strength for postural correction.  Baseline: 2/13: see above    12/05/23: Met for assessed UE musculature.  Goal status:  ACHIEVED  4. Patient will be able to read >45 minutes with improved mechanics and <2/10 pain in neck on NRPS to improve tolerance to participate in favorite hobby.  Baseline: 12/05/23: Pt tolerates reading up to 2 hours well with book sitting on stand on her lap c pillow.  Goal status: ACHIEVED    PLAN:  PT FREQUENCY: 1-2x/week  PT DURATION: 8 weeks  PLANNED INTERVENTIONS: 97164- PT Re-evaluation, 97110-Therapeutic exercises, 97530- Therapeutic activity, 97112- Neuromuscular re-education, 97535- Self Care, 40981- Manual therapy, 97014- Electrical stimulation (unattended), [id number]- Electrical stimulation (manual), [id number]- Traction (mechanical), Patient/Family education, Taping, Dry Needling, Joint mobilization, Joint manipulation, Spinal manipulation, Spinal mobilization, Cryotherapy, and Moist heat  PLAN FOR NEXT SESSION: Pt to continue with HEP, trial of independent management with follow-up as needed if she experiences regression in condition  Consuela Mimes, PT, DPT [phone] Physical Therapist - [facility]   12/05/2023, 9:10 AM

## 2023-12-07 ENCOUNTER — Ambulatory Visit: Payer: Medicare PPO | Admitting: Physical Therapy

## 2023-12-08 ENCOUNTER — Encounter: Payer: Self-pay | Admitting: Physical Therapy

## 2024-01-09 DIAGNOSIS — L814 Other melanin hyperpigmentation: Secondary | ICD-10-CM | POA: Diagnosis not present

## 2024-01-09 DIAGNOSIS — L821 Other seborrheic keratosis: Secondary | ICD-10-CM | POA: Diagnosis not present

## 2024-01-09 DIAGNOSIS — L578 Other skin changes due to chronic exposure to nonionizing radiation: Secondary | ICD-10-CM | POA: Diagnosis not present

## 2024-01-09 DIAGNOSIS — L570 Actinic keratosis: Secondary | ICD-10-CM | POA: Diagnosis not present

## 2024-01-09 DIAGNOSIS — D229 Melanocytic nevi, unspecified: Secondary | ICD-10-CM | POA: Diagnosis not present

## 2024-01-09 DIAGNOSIS — Z85828 Personal history of other malignant neoplasm of skin: Secondary | ICD-10-CM | POA: Diagnosis not present

## 2024-01-29 ENCOUNTER — Ambulatory Visit
Admission: EM | Admit: 2024-01-29 | Discharge: 2024-01-29 | Disposition: A | Discharge status: Home / Self Care | Attending: Emergency Medicine | Admitting: Emergency Medicine

## 2024-01-29 ENCOUNTER — Ambulatory Visit (INDEPENDENT_AMBULATORY_CARE_PROVIDER_SITE_OTHER)

## 2024-01-29 DIAGNOSIS — S62114A Nondisplaced fracture of triquetrum [cuneiform] bone, right wrist, initial encounter for closed fracture: Secondary | ICD-10-CM | POA: Diagnosis not present

## 2024-01-29 DIAGNOSIS — M25531 Pain in right wrist: Secondary | ICD-10-CM

## 2024-01-29 DIAGNOSIS — S0181XA Laceration without foreign body of other part of head, initial encounter: Secondary | ICD-10-CM | POA: Diagnosis not present

## 2024-01-29 DIAGNOSIS — S52121A Displaced fracture of head of right radius, initial encounter for closed fracture: Secondary | ICD-10-CM | POA: Diagnosis not present

## 2024-01-29 DIAGNOSIS — Z23 Encounter for immunization: Secondary | ICD-10-CM | POA: Diagnosis not present

## 2024-01-29 MED ORDER — TETANUS-DIPHTH-ACELL PERTUSSIS 5-2.5-18.5 LF-MCG/0.5 IM SUSY
0.5000 mL | PREFILLED_SYRINGE | Freq: Once | INTRAMUSCULAR | Status: AC
  Administered 2024-01-29: 0.5 mL via INTRAMUSCULAR

## 2024-01-29 NOTE — Discharge Instructions (Signed)
 Keep the wound on your forehead clean and dry.  Keeping it out of the sun will help minimize scarring.  The stitches need to remain in place for the next 5 days and then you may return here and we can remove them.  Wear the wrist brace to help protect your wrist from further injury.  You may take it off to shower and wash your hands but otherwise keep it in place.  The x-rays show a possible injury of one of the bones in your wrist.  You need to follow-up with orthopedics later this week.

## 2024-01-29 NOTE — ED Provider Notes (Addendum)
 [facility]    CSN: [id number] Arrival date & time: 01/29/24  1821      History   Chief Complaint Chief Complaint  Patient presents with   Hand Injury    HPI Melinda Barber is a 68 y.o. female.   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  She also sustained a laceration to the left side of her forehead at the same time.  She did not have a loss of consciousness and she denies any change in vision, nausea, or vomiting.  She was wearing glasses and her glasses broke.  She was able to get herself up and walk back to her car where her husband was waiting to let him know what happened.  Past Medical History:  Diagnosis Date   Carotid bruit    Complication of anesthesia    with 1st colonoscopy in [place] was "given too much anesthesia" and was loopy and had stomach issues for days-2nd colonoscopy no problems   Elevated hemoglobin (HCC)    GERD (gastroesophageal reflux disease)    occ no meds   Hyperbilirubinemia    Hypercholesterolemia    Hypertension    Inguinal hernia    Palpitations    Tremor     Patient Active Problem List   Diagnosis Date Noted   Ear fullness 10/01/2023   Neck pain 03/28/2023   Hip pain, right 03/28/2023   Breast cancer screening 05/15/2022   Carotid bruit 03/22/2022   Headache 03/22/2022   Abdominal pain 03/22/2022   History of nonmelanoma skin cancer 08/02/2021   Tremor 03/23/2020   Inguinal hernia 03/23/2020   Palpitations 03/17/2018   Stress 03/09/2015   Health care maintenance 11/10/2014   Heme positive stool 04/28/2014   BCC (basal cell carcinoma), face 03/04/2013   Hyperbilirubinemia 09/09/2012   Menopausal syndrome 09/09/2012   Hypertension 09/06/2012   Hypercholesterolemia 09/06/2012    Past  Surgical History:  Procedure Laterality Date   COLONOSCOPY     INGUINAL HERNIA REPAIR Right 07/25/2022   Procedure: HERNIA REPAIR INGUINAL ADULT;  Surgeon: Marshall Skeeter, MD;  Location: [facility] OR[facility];  Service: General;  Laterality: Right;   INSERTION OF MESH Right 07/25/2022   Procedure: INSERTION OF MESH;  Surgeon: Marshall Skeeter, MD;  Location: [facility] OR[facility];  Service: General;  Laterality: Right;    OB History   No obstetric history on file.      Home Medications    Prior to Admission medications   Medication Sig Start Date End Date Taking? Authorizing Provider  amLODipine  (NORVASC ) 10 MG tablet Take 1 tablet (10 mg total) by mouth daily. 09/28/23   Dellar Fenton, MD  calcium carbonate (OSCAL) 1500 (600 Ca) MG TABS tablet Take 1,500 mg by mouth 2 (two) times daily with a meal.    [provider]  carbamide peroxide (DEBROX) 6.5 % OTIC solution 3-4 drops in each ear daily.  Massage for approximately 5 minutes. 09/28/23   Dellar Fenton, MD  losartan -hydrochlorothiazide (HYZAAR) 100-25 MG tablet Take 1 tablet by mouth daily. 09/28/23   Dellar Fenton, MD  metoprolol  succinate (TOPROL -XL) 25 MG 24 hr tablet TAKE 1 TABLET BY MOUTH EVERY DAY 09/28/23   Dellar Fenton, MD  Multiple Vitamin (MULTIVITAMIN) capsule Take 1 capsule by mouth daily.    [provider]    [facility]  History Family History  Problem Relation Age of Onset   Breast cancer Other 21   Hypertension Mother    Hypercholesterolemia Mother    Diabetes Mother    Transient ischemic attack Mother    Aneurysm Father    Hypertension Brother    Aneurysm Brother        heart   Colon cancer Neg Hx     Social History Social History   Tobacco Use   Smoking status: Former    Current packs/day: 0.00    Average packs/day: 0.5 packs/day for 12.0 years (6.0 ttl pk-yrs)    Types: Cigarettes    Start date: 09/20/1979    Quit date: 09/20/1991    Years since quitting: 32.3   Smokeless tobacco: Never  Vaping Use    Vaping status: Never Used  Substance Use Topics   Alcohol use: Yes    Comment: occ wine   Drug use: No     Allergies   Patient has no known allergies.   Review of Systems Review of Systems  Eyes:  Negative for visual disturbance.  Gastrointestinal:  Negative for nausea and vomiting.  Musculoskeletal:  Positive for arthralgias. Negative for myalgias.  Skin:  Positive for color change and wound.  Neurological:  Negative for syncope.     Physical Exam Triage Vital Signs ED Triage Vitals  Encounter Vitals Group     BP 01/29/24 1847 (!) 146/96     Systolic BP Percentile --      Diastolic BP Percentile --      Pulse Rate 01/29/24 1847 99     Resp 01/29/24 1847 16     Temp 01/29/24 1847 99.3 F (37.4 C)     Temp Source 01/29/24 1847 Oral     SpO2 01/29/24 1847 97 %     Weight --      Height --      Head Circumference --      Peak Flow --      Pain Score 01/29/24 1846 4     Pain Loc --      Pain Education --      Exclude from Growth Chart --    No data found.  Updated Vital Signs BP (!) 146/96 (BP Location: Left Arm)   Pulse 99   Temp 99.3 F (37.4 C) (Oral)   Resp 16   LMP 06/28/2006   SpO2 97%   Visual Acuity Right Eye Distance:   Left Eye Distance:   Bilateral Distance:    Right Eye Near:   Left Eye Near:    Bilateral Near:     Physical Exam Vitals and nursing note reviewed.  Constitutional:      Appearance: Normal appearance. She is not ill-appearing.  HENT:     Head: Normocephalic.  Eyes:     Extraocular Movements: Extraocular movements intact.     Pupils: Pupils are equal, round, and reactive to light.  Musculoskeletal:        General: Swelling, tenderness and signs of injury present.  Skin:    General: Skin is warm and dry.     Capillary Refill: Capillary refill takes less than 2 seconds.     Findings: Bruising present.  Neurological:     General: No focal deficit present.     Mental Status: She is alert and oriented to person, place,  and time.      UC Treatments / Results  Labs (all labs ordered are listed, but only abnormal results are displayed)  Labs Reviewed - No data to display  EKG   Radiology DG Wrist Complete Right Result Date: 01/29/2024 CLINICAL DATA:  Recent fall with wrist pain, initial encounter EXAM: RIGHT WRIST - COMPLETE 3+ VIEW COMPARISON:  None Available. FINDINGS: There is evidence of a posterior triquetral fracture with adjacent soft tissue swelling. No other fracture is seen. IMPRESSION: Posterior triquetral fracture. Electronically Signed   By: Violeta Grey M.D.   On: 01/29/2024 20:17    Procedures Procedures (including critical care time)  Medications Ordered in UC Medications  Tdap (BOOSTRIX) injection 0.5 mL (0.5 mLs Intramuscular Given 01/29/24 1940)    Initial Impression / Assessment and Plan / UC Course  I have reviewed the triage vital signs and the nursing notes.  Pertinent labs & imaging results that were available during my care of the patient were reviewed by me and considered in my medical decision making (see chart for details).   Patient is a pleasant, nontoxic-appearing 68 year old female presenting for evaluation of of pain in her right wrist as well as a laceration to the left side of her forehead as outlined in HPI above.  In the exam room patient's wrist is in normal anatomical alignment and there is very mild swelling to the wrist.  She has full range of motion without pain or difficulty.  No numbness or tingling in her fingers.  Grip strength is 5/5.  She does have mild tenderness with palpation of the carpal bones and with compression of the radial and ulnar styloid.  I will obtain a radiograph of the right wrist rule out any bony abnormality.  The patient also has a 3 cm laceration of the above her orbit on the left side of her forehead into her temple that approximates well and is not actively bleeding.  She has no tenderness with palpation of her orbital rim.  Pupils  are equal and reactive and EOM is intact.  Forehead laceration was anesthetized with 2-1/2 mL of 1% lidocaine  with epi.  Once good anesthesia was achieved the wound was cleansed with chlorhexidine  saline and closed with 3 simple interrupted sutures of 3-0 Prolene.  The wound was again cleansed with chlorhexidine  and patted dry.  Patient Toller procedure well.  Right wrist x-rays independently reviewed and evaluated by me.  Impression: There is an irregularity to the edge of the triquetrium.  Also mild soft tissue swelling present.  Radiology overread is pending. Radiology impression states posterior triquetral fracture.  I will have staff place patient in her Velcro wrist brace to immobilize her wrist to keep in neutral position and protect it from further injury.  I will have her follow-up with orthopedics.   Final Clinical Impressions(s) / UC Diagnoses   Final diagnoses:  Acute pain of right wrist  Forehead laceration, initial encounter  Closed nondisplaced fracture of triquetrum of right wrist, initial encounter     Discharge Instructions      Keep the wound on your forehead clean and dry.  Keeping it out of the sun will help minimize scarring.  The stitches need to remain in place for the next 5 days and then you may return here and we can remove them.  Wear the wrist brace to help protect your wrist from further injury.  You may take it off to shower and wash your hands but otherwise keep it in place.  The x-rays show a possible injury of one of the bones in your wrist.  You need to follow-up with orthopedics later this  week.   ED Prescriptions   None    PDMP not reviewed this encounter.   Kent Pear, NP 01/29/24 2013    Kent Pear, NP 01/29/24 2023

## 2024-01-29 NOTE — ED Triage Notes (Addendum)
 Patient presents to UC for right hand injury and possible stiches for a laceration to left forehead. States she fell off porch today. Last Tdap unknown. Daughter is a Engineer, civil (consulting), LAC site was cleaned.   Denies head injury.

## 2024-01-30 ENCOUNTER — Ambulatory Visit (HOSPITAL_COMMUNITY): Payer: Self-pay

## 2024-01-30 DIAGNOSIS — S62101A Fracture of unspecified carpal bone, right wrist, initial encounter for closed fracture: Secondary | ICD-10-CM | POA: Diagnosis not present

## 2024-01-30 DIAGNOSIS — S60212A Contusion of left wrist, initial encounter: Secondary | ICD-10-CM | POA: Diagnosis not present

## 2024-02-03 ENCOUNTER — Ambulatory Visit: Admission: EM | Admit: 2024-02-03 | Discharge: 2024-02-03 | Disposition: A | Discharge status: Home / Self Care

## 2024-02-03 DIAGNOSIS — Z4802 Encounter for removal of sutures: Secondary | ICD-10-CM | POA: Diagnosis not present

## 2024-02-03 NOTE — ED Triage Notes (Signed)
 Wound looked cleaned.  No redness Pt had no questions or concerns.   Removed 3 sutures.

## 2024-02-09 ENCOUNTER — Encounter: Payer: Self-pay | Admitting: Internal Medicine

## 2024-02-09 DIAGNOSIS — S62124A Nondisplaced fracture of lunate [semilunar], right wrist, initial encounter for closed fracture: Secondary | ICD-10-CM | POA: Diagnosis not present

## 2024-02-09 DIAGNOSIS — S62101A Fracture of unspecified carpal bone, right wrist, initial encounter for closed fracture: Secondary | ICD-10-CM | POA: Insufficient documentation

## 2024-03-29 ENCOUNTER — Ambulatory Visit (INDEPENDENT_AMBULATORY_CARE_PROVIDER_SITE_OTHER): Payer: BC Managed Care – PPO | Admitting: Internal Medicine

## 2024-03-29 ENCOUNTER — Encounter: Payer: Self-pay | Admitting: Internal Medicine

## 2024-03-29 VITALS — BP 118/70 | HR 61 | Temp 98.0°F | Resp 16 | Ht 62.0 in | Wt 136.0 lb

## 2024-03-29 DIAGNOSIS — I10 Essential (primary) hypertension: Secondary | ICD-10-CM | POA: Diagnosis not present

## 2024-03-29 DIAGNOSIS — F439 Reaction to severe stress, unspecified: Secondary | ICD-10-CM | POA: Diagnosis not present

## 2024-03-29 DIAGNOSIS — E7800 Pure hypercholesterolemia, unspecified: Secondary | ICD-10-CM

## 2024-03-29 DIAGNOSIS — Z1231 Encounter for screening mammogram for malignant neoplasm of breast: Secondary | ICD-10-CM

## 2024-03-29 DIAGNOSIS — S62101S Fracture of unspecified carpal bone, right wrist, sequela: Secondary | ICD-10-CM | POA: Diagnosis not present

## 2024-03-29 DIAGNOSIS — Z0000 Encounter for general adult medical examination without abnormal findings: Secondary | ICD-10-CM | POA: Diagnosis not present

## 2024-03-29 NOTE — Assessment & Plan Note (Signed)
Recheck liver panel with next labs.   

## 2024-03-29 NOTE — Assessment & Plan Note (Signed)
 The 10-year ASCVD risk score (Arnett DK, et al., 2019) is: 5.9%   Values used to calculate the score:     Age: 68 years     Clincally relevant sex: Female     Is Non-Hispanic African American: No     Diabetic: No     Tobacco smoker: No     Systolic Blood Pressure: 118 mmHg     Is BP treated: Yes     HDL Cholesterol: 69.5 mg/dL     Total Cholesterol: 210 mg/dL  Low cholesterol diet and exercise.  Check lipid panel with next fasting labs.

## 2024-03-29 NOTE — Assessment & Plan Note (Addendum)
 Physical today 03/29/24.  PAP 03/18/21 - negative with negative HPV.  Atrophy changes.  Colonoscopy 05/2019 - diverticulosis and internal hemorrhoids. Will contact Dr Tye - recommended 5-10 years - to confirm when wants recheck. Mammogram 05/24/23 - Birads I.  Mammogram ordered. She will schedule.

## 2024-03-29 NOTE — Assessment & Plan Note (Signed)
 Discussed.  Overall she feels she is handling things well.  Will notify me if she feels she needs any further intervention.

## 2024-03-29 NOTE — Assessment & Plan Note (Signed)
Blood pressure as outlined.  Doing well. On amlodipine 10mg  q day and losartan/hctz.  Follow pressures.  Follow metabolic panel

## 2024-03-29 NOTE — Assessment & Plan Note (Signed)
 Saw [facility]. Was diagnosed with a wrist fracture.  Wore a brace/cast.  No pain.  Follow.

## 2024-03-29 NOTE — Progress Notes (Signed)
 Subjective:    Patient ID: Melinda Barber, female    DOB: [birth year], 68 y.o.   MRN: [id number]  Patient here for  Chief Complaint  Patient presents with   Annual Exam    HPI Here for a physical exam. Seeing ortho - per their review noted dorsal chip off the lunate- right wrist. Thumb spica splint. Referred to PT for neck pain. Wrist is doing well. No pain. No chest pain or sob reported. No abdominal pain. Bowels moving regularly. Discussed colonoscopy. Will d/w Dr Tye for when due. Discussed pneumonia vaccine. Will notify me if agreeable. Increased stress - family stress. Discussed. Overall handling things relatively well. Has good support.    Past Medical History:  Diagnosis Date   Carotid bruit    Complication of anesthesia    with 1st colonoscopy in [place] was given too much anesthesia and was loopy and had stomach issues for days-2nd colonoscopy no problems   Elevated hemoglobin (HCC)    GERD (gastroesophageal reflux disease)    occ no meds   Hyperbilirubinemia    Hypercholesterolemia    Hypertension    Inguinal hernia    Palpitations    Tremor    Past Surgical History:  Procedure Laterality Date   COLONOSCOPY     INGUINAL HERNIA REPAIR Right 07/25/2022   Procedure: HERNIA REPAIR INGUINAL ADULT;  Surgeon: Dessa Reyes ORN, MD;  Location: [facility];  Service: General;  Laterality: Right;   INSERTION OF MESH Right 07/25/2022   Procedure: INSERTION OF MESH;  Surgeon: Dessa Reyes ORN, MD;  Location: [facility];  Service: General;  Laterality: Right;   Family History  Problem Relation Age of Onset   Breast cancer Other 10   Hypertension Mother    Hypercholesterolemia Mother    Diabetes Mother    Transient ischemic attack Mother    Aneurysm Father    Hypertension Brother    Aneurysm Brother        heart   Colon cancer Neg Hx    Social History   Socioeconomic History   Marital status: Married    Spouse name: Not on file   Number of children: 3   Years  of education: Not on file   Highest education level: Not on file  Occupational History   Not on file  Tobacco Use   Smoking status: Former    Current packs/day: 0.00    Average packs/day: 0.5 packs/day for 12.0 years (6.0 ttl pk-yrs)    Types: Cigarettes    Start date: 09/20/1979    Quit date: 09/20/1991    Years since quitting: 32.5   Smokeless tobacco: Never  Vaping Use   Vaping status: Never Used  Substance and Sexual Activity   Alcohol use: Yes    Comment: occ wine   Drug use: No   Sexual activity: Not on file  Other Topics Concern   Not on file  Social History Narrative   Not on file   Social Drivers of Health   Financial Resource Strain: Not on file  Food Insecurity: Not on file  Transportation Needs: Not on file  Physical Activity: Not on file  Stress: Not on file  Social Connections: Not on file     Review of Systems  Constitutional:  Negative for appetite change and unexpected weight change.  HENT:  Negative for congestion, sinus pressure and sore throat.   Eyes:  Negative for pain and visual disturbance.  Respiratory:  Negative for cough, chest tightness and shortness  of breath.   Cardiovascular:  Negative for chest pain, palpitations and leg swelling.  Gastrointestinal:  Negative for abdominal pain, diarrhea, nausea and vomiting.  Genitourinary:  Negative for difficulty urinating and dysuria.  Musculoskeletal:  Negative for joint swelling and myalgias.  Skin:  Negative for color change and rash.  Neurological:  Negative for dizziness and headaches.  Hematological:  Negative for adenopathy. Does not bruise/bleed easily.  Psychiatric/Behavioral:  Negative for agitation and dysphoric mood.        Increased stress.        Objective:     BP 118/70   Pulse 61   Temp 98 F (36.7 C)   Resp 16   Ht 5' 2 (1.575 m)   Wt 136 lb (61.7 kg)   LMP 06/28/2006   SpO2 99%   BMI 24.87 kg/m  Wt Readings from Last 3 Encounters:  03/29/24 136 lb (61.7 kg)   09/28/23 136 lb (61.7 kg)  03/28/23 138 lb (62.6 kg)    Physical Exam Vitals reviewed.  Constitutional:      General: She is not in acute distress.    Appearance: Normal appearance. She is well-developed.  HENT:     Head: Normocephalic and atraumatic.     Right Ear: External ear normal.     Left Ear: External ear normal.     Mouth/Throat:     Pharynx: No oropharyngeal exudate or posterior oropharyngeal erythema.  Eyes:     General: No scleral icterus.       Right eye: No discharge.        Left eye: No discharge.     Conjunctiva/sclera: Conjunctivae normal.  Neck:     Thyroid : No thyromegaly.  Cardiovascular:     Rate and Rhythm: Normal rate and regular rhythm.  Pulmonary:     Effort: No tachypnea, accessory muscle usage or respiratory distress.     Breath sounds: Normal breath sounds. No decreased breath sounds or wheezing.  Chest:  Breasts:    Right: No inverted nipple, mass, nipple discharge or tenderness (no axillary adenopathy).     Left: No inverted nipple, mass, nipple discharge or tenderness (no axilarry adenopathy).  Abdominal:     General: Bowel sounds are normal.     Palpations: Abdomen is soft.     Tenderness: There is no abdominal tenderness.  Musculoskeletal:        General: No swelling or tenderness.     Cervical back: Neck supple.  Lymphadenopathy:     Cervical: No cervical adenopathy.  Skin:    Findings: No erythema or rash.  Neurological:     Mental Status: She is alert and oriented to person, place, and time.  Psychiatric:        Mood and Affect: Mood normal.        Behavior: Behavior normal.         Outpatient Encounter Medications as of 03/29/2024  Medication Sig   amLODipine  (NORVASC ) 10 MG tablet Take 1 tablet (10 mg total) by mouth daily.   calcium carbonate (OSCAL) 1500 (600 Ca) MG TABS tablet Take 1,500 mg by mouth 2 (two) times daily with a meal.   carbamide peroxide (DEBROX) 6.5 % OTIC solution 3-4 drops in each ear daily.  Massage  for approximately 5 minutes.   losartan -hydrochlorothiazide (HYZAAR) 100-25 MG tablet Take 1 tablet by mouth daily.   metoprolol  succinate (TOPROL -XL) 25 MG 24 hr tablet TAKE 1 TABLET BY MOUTH EVERY DAY   Multiple Vitamin (MULTIVITAMIN) capsule Take 1  capsule by mouth daily.   No facility-administered encounter medications on file as of 03/29/2024.     Lab Results  Component Value Date   WBC 4.4 09/28/2023   HGB 14.9 09/28/2023   HCT 45.1 09/28/2023   PLT 232.0 09/28/2023   GLUCOSE 86 09/28/2023   CHOL 210 (H) 09/28/2023   TRIG 132.0 09/28/2023   HDL 69.50 09/28/2023   LDLDIRECT 112.0 06/14/2018   LDLCALC 114 (H) 09/28/2023   ALT 18 09/28/2023   AST 20 09/28/2023   NA 137 09/28/2023   K 3.5 09/28/2023   CL 100 09/28/2023   CREATININE 0.67 09/28/2023   BUN 19 09/28/2023   CO2 29 09/28/2023   TSH 1.19 03/28/2023   HGBA1C 5.3 03/27/2019       Assessment & Plan:  Hypercholesterolemia Assessment & Plan: The 10-year ASCVD risk score (Arnett DK, et al., 2019) is: 5.9%   Values used to calculate the score:     Age: 3 years     Clincally relevant sex: Female     Is Non-Hispanic African American: No     Diabetic: No     Tobacco smoker: No     Systolic Blood Pressure: 118 mmHg     Is BP treated: Yes     HDL Cholesterol: 69.5 mg/dL     Total Cholesterol: 210 mg/dL  Low cholesterol diet and exercise.  Check lipid panel with next fasting labs.   Orders: -     Hepatic function panel; Future -     Lipid panel; Future -     TSH; Future  Primary hypertension Assessment & Plan: Blood pressure as outlined.  Doing well. On amlodipine  10mg  q day and losartan /hctz.  Follow pressures.  Follow metabolic panel.   Orders: -     Basic metabolic panel with GFR; Future  Health care maintenance Assessment & Plan: Physical today 03/29/24.  PAP 03/18/21 - negative with negative HPV.  Atrophy changes.  Colonoscopy 05/2019 - diverticulosis and internal hemorrhoids. Will contact Dr Tye -  recommended 5-10 years - to confirm when wants recheck. Mammogram 05/24/23 - Birads I.  Mammogram ordered. She will schedule.    Visit for screening mammogram -     3D Screening Mammogram, Left and Right; Future  Hyperbilirubinemia Assessment & Plan: Recheck liver panel with next labs.    Stress Assessment & Plan: Discussed.  Overall she feels she is handling things well.  Will notify me if she feels she needs any further intervention.   Closed fracture of right wrist, sequela Assessment & Plan: Saw [facility]. Was diagnosed with a wrist fracture.  Wore a brace/cast.  No pain.  Follow.      Allena Hamilton, MD

## 2024-04-01 ENCOUNTER — Telehealth: Payer: Self-pay | Admitting: *Deleted

## 2024-04-01 NOTE — Telephone Encounter (Signed)
 Dr Tye from [facility] called to let Dr Glendia know that pt needs Colonoscopy repeat in 6 years. He was told to ask for Trisha, but Sueanne is not in the office today.  I updated health maintenance but the only option is 5 or 7 years, so I chose 5.

## 2024-04-01 NOTE — Telephone Encounter (Signed)
 Noted. Please call and notify Melinda Barber that the recommendation for f/u colonoscopy is 6 years after last colonoscopy.

## 2024-04-02 NOTE — Telephone Encounter (Signed)
 Called patient to make her aware of recommendations. Unable to reach patient due to a telephone error message stating call cannot be completed as dialed. Will attempt again later.

## 2024-04-04 NOTE — Telephone Encounter (Signed)
 Patient aware.

## 2024-04-05 ENCOUNTER — Other Ambulatory Visit (INDEPENDENT_AMBULATORY_CARE_PROVIDER_SITE_OTHER)

## 2024-04-05 DIAGNOSIS — I10 Essential (primary) hypertension: Secondary | ICD-10-CM | POA: Diagnosis not present

## 2024-04-05 DIAGNOSIS — E7800 Pure hypercholesterolemia, unspecified: Secondary | ICD-10-CM

## 2024-04-05 DIAGNOSIS — R17 Unspecified jaundice: Secondary | ICD-10-CM

## 2024-04-05 LAB — HEPATIC FUNCTION PANEL
ALT: 19 U/L (ref 0–35)
AST: 20 U/L (ref 0–37)
Albumin: 4.4 g/dL (ref 3.5–5.2)
Alkaline Phosphatase: 97 U/L (ref 39–117)
Bilirubin, Direct: 0.2 mg/dL (ref 0.0–0.3)
Total Bilirubin: 1.5 mg/dL — ABNORMAL HIGH (ref 0.2–1.2)
Total Protein: 6.7 g/dL (ref 6.0–8.3)

## 2024-04-05 LAB — BASIC METABOLIC PANEL WITH GFR
BUN: 16 mg/dL (ref 6–23)
CO2: 32 meq/L (ref 19–32)
Calcium: 9.4 mg/dL (ref 8.4–10.5)
Chloride: 101 meq/L (ref 96–112)
Creatinine, Ser: 0.69 mg/dL (ref 0.40–1.20)
GFR: 90.73 mL/min (ref >60.00)
Glucose, Bld: 88 mg/dL (ref 70–99)
Potassium: 3.9 meq/L (ref 3.5–5.1)
Sodium: 140 meq/L (ref 135–145)

## 2024-04-05 LAB — LIPID PANEL
Cholesterol: 181 mg/dL (ref 0–200)
HDL: 64.1 mg/dL (ref >39.00)
LDL Cholesterol: 95 mg/dL (ref 0–99)
NonHDL: 117.23
Total CHOL/HDL Ratio: 3
Triglycerides: 113 mg/dL (ref 0.0–149.0)
VLDL: 22.6 mg/dL (ref 0.0–40.0)

## 2024-04-05 LAB — TSH: TSH: 1.94 u[IU]/mL (ref 0.35–5.50)

## 2024-04-07 ENCOUNTER — Ambulatory Visit: Payer: Self-pay | Admitting: Internal Medicine

## 2024-04-09 ENCOUNTER — Ambulatory Visit (INDEPENDENT_AMBULATORY_CARE_PROVIDER_SITE_OTHER): Admitting: Internal Medicine

## 2024-04-09 ENCOUNTER — Encounter: Payer: Self-pay | Admitting: Internal Medicine

## 2024-04-09 ENCOUNTER — Ambulatory Visit: Payer: Self-pay | Admitting: Internal Medicine

## 2024-04-09 VITALS — BP 114/68 | HR 69 | Resp 16 | Ht 62.0 in | Wt 136.0 lb

## 2024-04-09 DIAGNOSIS — I10 Essential (primary) hypertension: Secondary | ICD-10-CM | POA: Diagnosis not present

## 2024-04-09 DIAGNOSIS — Z0000 Encounter for general adult medical examination without abnormal findings: Secondary | ICD-10-CM | POA: Diagnosis not present

## 2024-04-09 DIAGNOSIS — E7800 Pure hypercholesterolemia, unspecified: Secondary | ICD-10-CM | POA: Diagnosis not present

## 2024-04-09 DIAGNOSIS — F439 Reaction to severe stress, unspecified: Secondary | ICD-10-CM

## 2024-04-09 DIAGNOSIS — Z7185 Encounter for immunization safety counseling: Secondary | ICD-10-CM | POA: Diagnosis not present

## 2024-04-09 NOTE — Progress Notes (Unsigned)
 Subjective:    Melinda Barber is a 68 y.o. female who presents for a Welcome to Medicare exam.   Cardiac Risk Factors include: advanced age (>78men, >66 women);hypertension;smoking/ tobacco exposure      Objective:    Today's Vitals   04/09/24 1533  BP: 114/68  Pulse: 69  Resp: 16  SpO2: 98%  Weight: 136 lb (61.7 kg)  Height: 5' 2 (1.575 m)  Body mass index is 24.87 kg/m.  Medications Outpatient Encounter Medications as of 04/09/2024  Medication Sig   amLODipine  (NORVASC ) 10 MG tablet Take 1 tablet (10 mg total) by mouth daily.   calcium carbonate (OSCAL) 1500 (600 Ca) MG TABS tablet Take 1,500 mg by mouth 2 (two) times daily with a meal.   carbamide peroxide (DEBROX) 6.5 % OTIC solution 3-4 drops in each ear daily.  Massage for approximately 5 minutes.   losartan -hydrochlorothiazide (HYZAAR) 100-25 MG tablet Take 1 tablet by mouth daily.   metoprolol  succinate (TOPROL -XL) 25 MG 24 hr tablet TAKE 1 TABLET BY MOUTH EVERY DAY   Multiple Vitamin (MULTIVITAMIN) capsule Take 1 capsule by mouth daily.   No facility-administered encounter medications on file as of 04/09/2024.     History: Past Medical History:  Diagnosis Date   Carotid bruit    Complication of anesthesia    with 1st colonoscopy in [place] was given too much anesthesia and was loopy and had stomach issues for days-2nd colonoscopy no problems   Elevated hemoglobin (HCC)    GERD (gastroesophageal reflux disease)    occ no meds   Hyperbilirubinemia    Hypercholesterolemia    Hypertension    Inguinal hernia    Palpitations    Tremor    Past Surgical History:  Procedure Laterality Date   COLONOSCOPY     INGUINAL HERNIA REPAIR Right 07/25/2022   Procedure: HERNIA REPAIR INGUINAL ADULT;  Surgeon: Dessa Reyes ORN, MD;  Location: [facility] OR[facility];  Service: General;  Laterality: Right;   INSERTION OF MESH Right 07/25/2022   Procedure: INSERTION OF MESH;  Surgeon: Dessa Reyes ORN, MD;  Location: [facility] OR[facility];   Service: General;  Laterality: Right;    Family History  Problem Relation Age of Onset   Breast cancer Other 7   Hypertension Mother    Hypercholesterolemia Mother    Diabetes Mother    Transient ischemic attack Mother    Aneurysm Father    Hypertension Brother    Aneurysm Brother        heart   Colon cancer Neg Hx    Social History   Occupational History   Not on file  Tobacco Use   Smoking status: Former    Current packs/day: 0.00    Average packs/day: 0.5 packs/day for 12.0 years (6.0 ttl pk-yrs)    Types: Cigarettes    Start date: 09/20/1979    Quit date: 09/20/1991    Years since quitting: 32.5   Smokeless tobacco: Never  Vaping Use   Vaping status: Never Used  Substance and Sexual Activity   Alcohol use: Yes    Comment: occ wine   Drug use: No   Sexual activity: Yes    Partners: Male    Tobacco Counseling Does not smoke.   Immunizations and Health Maintenance Immunization History  Administered Date(s) Administered   Influenza,inj,Quad PF,6+ Mos 07/31/2019, 07/17/2020, 07/09/2021, 06/29/2022   Influenza,inj,quad, With Preservative 07/26/2017   PFIZER(Purple Top)SARS-COV-2 Vaccination 05/20/2020, 06/10/2020   Tdap 01/29/2024   Zoster Recombinant(Shingrix) 10/13/2020, 01/12/2021   Health Maintenance  Due  Topic Date Due   Colonoscopy  06/09/2024  Discussed with GI and pt. She is due 2026 for colonoscopy.   Activities of Daily Living    04/09/2024    3:31 PM  In your present state of health, do you have any difficulty performing the following activities:  Hearing? 0  Vision? 1  Difficulty concentrating or making decisions? 0  Walking or climbing stairs? 0  Dressing or bathing? 0  Doing errands, shopping? 0  Preparing Food and eating ? N  Using the Toilet? N  In the past six months, have you accidently leaked urine? N  Do you have problems with loss of bowel control? N  Managing your Medications? N  Managing your Finances? N  Housekeeping or  managing your Housekeeping? N    Physical Exam   Physical Exam - HEENT - oropharynx - without lesions. Neck - supple. No audible bruit. Heart- appears to be regular. Lungs - clear. Abdomen - soft. Non tender. Extremities - no increased swelling.   Advanced Directives: Does Patient Have a Medical Advance Directive?: No Would patient like information on creating a medical advance directive?: No - Patient declined Discussed with her today regarding advance directives and health care power of attorney.   EKG:  EKG - SR - TWI - v1, v2 with some flattening v3. No acute ischemic changes.       Assessment:    This is a routine wellness examination for this patient .   Vision/Hearing screen Vision Screening   Right eye Left eye Both eyes  Without correction     With correction 20/20 20/25 20/20      Goals   None     Depression Screen    04/09/2024    3:13 PM 09/28/2023    8:32 AM 08/03/2022    8:02 AM 05/03/2022   11:45 AM  PHQ 2/9 Scores  PHQ - 2 Score 0 0 0 0     Fall Risk    04/09/2024    3:14 PM  Fall Risk   Falls in the past year? 1  Number falls in past yr: 0  Injury with Fall? 1  Risk for fall due to : History of fall(s)  Follow up Falls evaluation completed    Cognitive Function:        04/09/2024    3:10 PM  6CIT Screen  What Year? 0 points  What month? 0 points  What time? 0 points  Count back from 20 0 points  Months in reverse 0 points  Repeat phrase 0 points  Total Score 0 points    Patient Care Team: Glendia Shad, MD as PCP - General (Internal Medicine)     Plan:     I have personally reviewed and noted the following in the patient's chart:   Medical and social history Use of alcohol, tobacco or illicit drugs  Current medications and supplements including opioid prescriptions. Patient is not currently taking opioid prescriptions. Functional ability and status Nutritional status Physical activity Advanced directives List of other  physicians Hospitalizations, surgeries, and ER visits in previous 12 months Vitals Screenings to include cognitive, depression, and falls Referrals and appointments  In addition, I have reviewed and discussed with patient certain preventive protocols, quality metrics, and best practice recommendations. A written personalized care plan for preventive services as well as general preventive health recommendations were provided to patient.     Shad Glendia, MD 04/10/2024

## 2024-04-10 ENCOUNTER — Encounter: Payer: Self-pay | Admitting: Internal Medicine

## 2024-04-10 DIAGNOSIS — Z7185 Encounter for immunization safety counseling: Secondary | ICD-10-CM | POA: Insufficient documentation

## 2024-04-10 NOTE — Assessment & Plan Note (Signed)
  The 10-year ASCVD risk score (Arnett DK, et al., 2019) is: 5.3%   Values used to calculate the score:     Age: 68 years     Clincally relevant sex: Female     Is Non-Hispanic African American: No     Diabetic: No     Tobacco smoker: No     Systolic Blood Pressure: 114 mmHg     Is BP treated: Yes     HDL Cholesterol: 64.1 mg/dL     Total Cholesterol: 181 mg/dL  Low cholesterol diet and exercise.  Discussed recent labs. No change today.

## 2024-04-10 NOTE — Assessment & Plan Note (Signed)
 Discussed pneumonia vaccine.

## 2024-04-10 NOTE — Assessment & Plan Note (Signed)
 Blood pressure as outlined.  Doing well. On amlodipine  10mg  q day and losartan /hctz.  Follow pressures.  Follow metabolic panel. No changes in medication.

## 2024-04-10 NOTE — Assessment & Plan Note (Signed)
 Increased stress discussed. Family stress. She does not feel needs any further intervention at this time. Follow.

## 2024-04-10 NOTE — Assessment & Plan Note (Signed)
 Stable total bilirubin. Direct bilirubin wnl. Remainder of liver panel wnl. Follow.

## 2024-05-27 ENCOUNTER — Ambulatory Visit
Admission: RE | Admit: 2024-05-27 | Discharge: 2024-05-27 | Disposition: A | Discharge status: Home / Self Care | Source: Ambulatory Visit | Attending: Internal Medicine | Admitting: Internal Medicine

## 2024-05-27 DIAGNOSIS — Z1231 Encounter for screening mammogram for malignant neoplasm of breast: Secondary | ICD-10-CM | POA: Insufficient documentation

## 2024-06-24 DIAGNOSIS — H02881 Meibomian gland dysfunction right upper eyelid: Secondary | ICD-10-CM | POA: Diagnosis not present

## 2024-06-24 DIAGNOSIS — H02884 Meibomian gland dysfunction left upper eyelid: Secondary | ICD-10-CM | POA: Diagnosis not present

## 2024-06-24 DIAGNOSIS — H02883 Meibomian gland dysfunction of right eye, unspecified eyelid: Secondary | ICD-10-CM | POA: Diagnosis not present

## 2024-06-24 DIAGNOSIS — H5203 Hypermetropia, bilateral: Secondary | ICD-10-CM | POA: Diagnosis not present

## 2024-07-16 DIAGNOSIS — L570 Actinic keratosis: Secondary | ICD-10-CM | POA: Diagnosis not present

## 2024-07-16 DIAGNOSIS — Z85828 Personal history of other malignant neoplasm of skin: Secondary | ICD-10-CM | POA: Diagnosis not present

## 2024-07-16 DIAGNOSIS — D229 Melanocytic nevi, unspecified: Secondary | ICD-10-CM | POA: Diagnosis not present

## 2024-07-16 DIAGNOSIS — L814 Other melanin hyperpigmentation: Secondary | ICD-10-CM | POA: Diagnosis not present

## 2024-07-16 DIAGNOSIS — L821 Other seborrheic keratosis: Secondary | ICD-10-CM | POA: Diagnosis not present

## 2024-09-29 ENCOUNTER — Other Ambulatory Visit: Payer: Self-pay | Admitting: Internal Medicine

## 2024-10-01 ENCOUNTER — Ambulatory Visit: Admitting: Internal Medicine

## 2024-10-14 ENCOUNTER — Other Ambulatory Visit

## 2024-10-16 ENCOUNTER — Ambulatory Visit: Admitting: Internal Medicine

## 2024-12-06 ENCOUNTER — Other Ambulatory Visit

## 2024-12-13 ENCOUNTER — Ambulatory Visit: Admitting: Internal Medicine

## 2025-03-31 ENCOUNTER — Encounter: Admitting: Internal Medicine

## 2025-04-07 ENCOUNTER — Ambulatory Visit: Admitting: Internal Medicine

## 2025-04-14 ENCOUNTER — Encounter: Admitting: Internal Medicine
